# Patient Record
Sex: Female | Born: 1965 | Race: White | Hispanic: No | Marital: Married | Smoking: Never smoker
Health system: Southern US, Community
[De-identification: ages and names within clinical notes are randomized; demographics above are authoritative.]

## PROBLEM LIST (undated history)

## (undated) ENCOUNTER — Ambulatory Visit: Admission: EM

## (undated) DIAGNOSIS — Z9889 Other specified postprocedural states: Secondary | ICD-10-CM

## (undated) DIAGNOSIS — E039 Hypothyroidism, unspecified: Secondary | ICD-10-CM

## (undated) DIAGNOSIS — R112 Nausea with vomiting, unspecified: Secondary | ICD-10-CM

## (undated) DIAGNOSIS — D219 Benign neoplasm of connective and other soft tissue, unspecified: Secondary | ICD-10-CM

## (undated) DIAGNOSIS — E079 Disorder of thyroid, unspecified: Secondary | ICD-10-CM

## (undated) HISTORY — DX: Disorder of thyroid, unspecified: E07.9

## (undated) HISTORY — DX: Benign neoplasm of connective and other soft tissue, unspecified: D21.9

## (undated) HISTORY — PX: AUGMENTATION MAMMAPLASTY: SUR837

---

## 1998-07-07 ENCOUNTER — Other Ambulatory Visit: Admission: RE | Admit: 1998-07-07 | Discharge: 1998-07-07 | Payer: Self-pay | Admitting: Obstetrics and Gynecology

## 1999-08-03 ENCOUNTER — Other Ambulatory Visit: Admission: RE | Admit: 1999-08-03 | Discharge: 1999-08-03 | Payer: Self-pay | Admitting: Obstetrics and Gynecology

## 2000-08-06 ENCOUNTER — Other Ambulatory Visit: Admission: RE | Admit: 2000-08-06 | Discharge: 2000-08-06 | Payer: Self-pay | Admitting: Obstetrics and Gynecology

## 2001-08-19 ENCOUNTER — Other Ambulatory Visit: Admission: RE | Admit: 2001-08-19 | Discharge: 2001-08-19 | Payer: Self-pay | Admitting: Obstetrics and Gynecology

## 2004-11-24 ENCOUNTER — Encounter: Admission: RE | Admit: 2004-11-24 | Discharge: 2004-11-24 | Payer: Self-pay | Admitting: Obstetrics and Gynecology

## 2006-06-20 ENCOUNTER — Encounter: Admission: RE | Admit: 2006-06-20 | Discharge: 2006-06-20 | Payer: Self-pay | Admitting: Obstetrics and Gynecology

## 2008-04-27 ENCOUNTER — Encounter: Admission: RE | Admit: 2008-04-27 | Discharge: 2008-04-27 | Payer: Self-pay | Admitting: Family Medicine

## 2008-05-06 ENCOUNTER — Encounter: Admission: RE | Admit: 2008-05-06 | Discharge: 2008-05-06 | Payer: Self-pay | Admitting: Family Medicine

## 2009-10-27 ENCOUNTER — Encounter: Admission: RE | Admit: 2009-10-27 | Discharge: 2009-10-27 | Payer: Self-pay | Admitting: Obstetrics and Gynecology

## 2010-11-02 ENCOUNTER — Encounter: Admission: RE | Admit: 2010-11-02 | Discharge: 2010-11-02 | Payer: Self-pay | Admitting: Obstetrics and Gynecology

## 2011-09-27 ENCOUNTER — Other Ambulatory Visit: Payer: Self-pay | Admitting: Obstetrics and Gynecology

## 2011-09-27 DIAGNOSIS — Z1231 Encounter for screening mammogram for malignant neoplasm of breast: Secondary | ICD-10-CM

## 2011-11-06 ENCOUNTER — Ambulatory Visit
Admission: RE | Admit: 2011-11-06 | Discharge: 2011-11-06 | Disposition: A | Discharge status: Home / Self Care | Payer: BC Managed Care – PPO | Source: Ambulatory Visit | Attending: Obstetrics and Gynecology | Admitting: Obstetrics and Gynecology

## 2011-11-06 DIAGNOSIS — Z1231 Encounter for screening mammogram for malignant neoplasm of breast: Secondary | ICD-10-CM

## 2012-10-02 ENCOUNTER — Other Ambulatory Visit: Payer: Self-pay | Admitting: Obstetrics and Gynecology

## 2012-10-02 DIAGNOSIS — Z1231 Encounter for screening mammogram for malignant neoplasm of breast: Secondary | ICD-10-CM

## 2012-11-06 ENCOUNTER — Ambulatory Visit
Admission: RE | Admit: 2012-11-06 | Discharge: 2012-11-06 | Disposition: A | Discharge status: Home / Self Care | Payer: BC Managed Care – PPO | Source: Ambulatory Visit | Attending: Obstetrics and Gynecology | Admitting: Obstetrics and Gynecology

## 2012-11-06 DIAGNOSIS — Z1231 Encounter for screening mammogram for malignant neoplasm of breast: Secondary | ICD-10-CM

## 2013-10-01 ENCOUNTER — Other Ambulatory Visit: Payer: Self-pay

## 2013-10-01 DIAGNOSIS — Z1231 Encounter for screening mammogram for malignant neoplasm of breast: Secondary | ICD-10-CM

## 2013-12-22 ENCOUNTER — Ambulatory Visit
Admission: RE | Admit: 2013-12-22 | Discharge: 2013-12-22 | Disposition: A | Discharge status: Home / Self Care | Payer: BC Managed Care – PPO | Source: Ambulatory Visit

## 2013-12-22 DIAGNOSIS — Z1231 Encounter for screening mammogram for malignant neoplasm of breast: Secondary | ICD-10-CM

## 2014-11-10 ENCOUNTER — Other Ambulatory Visit: Payer: Self-pay

## 2014-11-10 DIAGNOSIS — Z1231 Encounter for screening mammogram for malignant neoplasm of breast: Secondary | ICD-10-CM

## 2014-12-23 ENCOUNTER — Ambulatory Visit
Admission: RE | Admit: 2014-12-23 | Discharge: 2014-12-23 | Disposition: A | Discharge status: Home / Self Care | Payer: 59 | Source: Ambulatory Visit

## 2014-12-23 DIAGNOSIS — Z1231 Encounter for screening mammogram for malignant neoplasm of breast: Secondary | ICD-10-CM

## 2014-12-24 ENCOUNTER — Other Ambulatory Visit: Payer: Self-pay | Admitting: Obstetrics and Gynecology

## 2014-12-24 DIAGNOSIS — R928 Other abnormal and inconclusive findings on diagnostic imaging of breast: Secondary | ICD-10-CM

## 2015-01-04 ENCOUNTER — Ambulatory Visit
Admission: RE | Admit: 2015-01-04 | Discharge: 2015-01-04 | Disposition: A | Discharge status: Home / Self Care | Payer: 59 | Source: Ambulatory Visit | Attending: Obstetrics and Gynecology | Admitting: Obstetrics and Gynecology

## 2015-01-04 DIAGNOSIS — R928 Other abnormal and inconclusive findings on diagnostic imaging of breast: Secondary | ICD-10-CM

## 2015-08-17 ENCOUNTER — Encounter: Payer: Self-pay | Admitting: *Deleted

## 2015-08-18 ENCOUNTER — Ambulatory Visit (INDEPENDENT_AMBULATORY_CARE_PROVIDER_SITE_OTHER): Payer: 59 | Admitting: *Deleted

## 2015-08-18 DIAGNOSIS — I8393 Asymptomatic varicose veins of bilateral lower extremities: Secondary | ICD-10-CM

## 2015-08-18 NOTE — Progress Notes (Signed)
X=.3% Sotradecol administered with a 27g butterfly.  Patient received a total of 5cc.  Cutaneous Laser:pulsed mode  810j/cm2 400 ms delay  13 ms Duration 0.5 spot  Treated all the blue spiders and some red. Easy access. Tol well. She may need CL clean up in 6 weeks. Did some on her face too.  Photos: No.  Compression stockings applied: Yes.

## 2015-08-23 ENCOUNTER — Encounter: Payer: Self-pay | Admitting: *Deleted

## 2015-09-28 ENCOUNTER — Encounter: Payer: Self-pay | Admitting: *Deleted

## 2015-09-29 ENCOUNTER — Ambulatory Visit (INDEPENDENT_AMBULATORY_CARE_PROVIDER_SITE_OTHER): Payer: 59 | Admitting: *Deleted

## 2015-09-29 DIAGNOSIS — I8393 Asymptomatic varicose veins of bilateral lower extremities: Secondary | ICD-10-CM

## 2015-09-29 NOTE — Progress Notes (Signed)
Did cutaneous laser everywhere. Tol very well. Hoping for good results. She will return in Dec for any further clean up.  Cutaneous Laser:pulsed mode  810j/cm2 400 ms delay  13 ms Duration 0.5 spot  Total pulses: 1593 Total energy 2.521  Total time::20  Photos: No.  Compression stockings applied: No.NA

## 2015-10-04 ENCOUNTER — Encounter: Payer: Self-pay | Admitting: Vascular Surgery

## 2015-10-27 ENCOUNTER — Ambulatory Visit (INDEPENDENT_AMBULATORY_CARE_PROVIDER_SITE_OTHER): Payer: 59 | Admitting: *Deleted

## 2015-10-27 DIAGNOSIS — I8393 Asymptomatic varicose veins of bilateral lower extremities: Secondary | ICD-10-CM

## 2015-10-27 NOTE — Progress Notes (Signed)
   Cutaneous Laser:pulsed mode  814j/cm2 400 ms delay  13 ms Duration 0.5 spot  Total pulses: 1134 Total energy 1.803  Total time::14  Photos: No.  Compression stockings applied: Yes.     Treated her tiny red vessels with more watts hoping for better closure. Tol well. Will follow prn.

## 2015-11-01 ENCOUNTER — Encounter: Payer: Self-pay | Admitting: Vascular Surgery

## 2015-11-03 ENCOUNTER — Ambulatory Visit: Payer: 59

## 2015-11-11 ENCOUNTER — Other Ambulatory Visit: Payer: Self-pay

## 2015-11-11 DIAGNOSIS — Z1231 Encounter for screening mammogram for malignant neoplasm of breast: Secondary | ICD-10-CM

## 2015-12-01 ENCOUNTER — Ambulatory Visit: Payer: 59

## 2015-12-27 ENCOUNTER — Ambulatory Visit: Payer: 59

## 2015-12-29 ENCOUNTER — Ambulatory Visit
Admission: RE | Admit: 2015-12-29 | Discharge: 2015-12-29 | Disposition: A | Discharge status: Home / Self Care | Payer: BLUE CROSS/BLUE SHIELD | Source: Ambulatory Visit

## 2015-12-29 DIAGNOSIS — Z1231 Encounter for screening mammogram for malignant neoplasm of breast: Secondary | ICD-10-CM

## 2015-12-30 ENCOUNTER — Other Ambulatory Visit: Payer: Self-pay | Admitting: Obstetrics and Gynecology

## 2015-12-30 DIAGNOSIS — R928 Other abnormal and inconclusive findings on diagnostic imaging of breast: Secondary | ICD-10-CM

## 2016-01-05 ENCOUNTER — Ambulatory Visit
Admission: RE | Admit: 2016-01-05 | Discharge: 2016-01-05 | Disposition: A | Discharge status: Home / Self Care | Payer: BLUE CROSS/BLUE SHIELD | Source: Ambulatory Visit | Attending: Obstetrics and Gynecology | Admitting: Obstetrics and Gynecology

## 2016-01-05 DIAGNOSIS — R928 Other abnormal and inconclusive findings on diagnostic imaging of breast: Secondary | ICD-10-CM

## 2016-10-18 ENCOUNTER — Other Ambulatory Visit: Payer: Self-pay | Admitting: Obstetrics and Gynecology

## 2016-10-18 DIAGNOSIS — Z9882 Breast implant status: Secondary | ICD-10-CM

## 2016-10-18 DIAGNOSIS — Z1231 Encounter for screening mammogram for malignant neoplasm of breast: Secondary | ICD-10-CM

## 2016-11-20 ENCOUNTER — Encounter (INDEPENDENT_AMBULATORY_CARE_PROVIDER_SITE_OTHER): Payer: BLUE CROSS/BLUE SHIELD | Admitting: Ophthalmology

## 2016-11-20 DIAGNOSIS — H43813 Vitreous degeneration, bilateral: Secondary | ICD-10-CM

## 2016-11-20 DIAGNOSIS — H33303 Unspecified retinal break, bilateral: Secondary | ICD-10-CM | POA: Diagnosis not present

## 2016-11-20 DIAGNOSIS — H4423 Degenerative myopia, bilateral: Secondary | ICD-10-CM

## 2016-12-13 ENCOUNTER — Ambulatory Visit (INDEPENDENT_AMBULATORY_CARE_PROVIDER_SITE_OTHER): Payer: BLUE CROSS/BLUE SHIELD | Admitting: Ophthalmology

## 2017-01-08 ENCOUNTER — Ambulatory Visit
Admission: RE | Admit: 2017-01-08 | Discharge: 2017-01-08 | Disposition: A | Discharge status: Home / Self Care | Payer: BLUE CROSS/BLUE SHIELD | Source: Ambulatory Visit | Attending: Obstetrics and Gynecology | Admitting: Obstetrics and Gynecology

## 2017-01-08 DIAGNOSIS — Z1231 Encounter for screening mammogram for malignant neoplasm of breast: Secondary | ICD-10-CM

## 2017-01-08 DIAGNOSIS — Z9882 Breast implant status: Secondary | ICD-10-CM

## 2017-07-23 ENCOUNTER — Encounter: Payer: Self-pay | Admitting: Endocrinology

## 2017-10-29 ENCOUNTER — Other Ambulatory Visit: Payer: Self-pay | Admitting: Obstetrics and Gynecology

## 2017-10-29 DIAGNOSIS — Z1231 Encounter for screening mammogram for malignant neoplasm of breast: Secondary | ICD-10-CM

## 2018-01-09 ENCOUNTER — Ambulatory Visit
Admission: RE | Admit: 2018-01-09 | Discharge: 2018-01-09 | Disposition: A | Discharge status: Home / Self Care | Payer: BLUE CROSS/BLUE SHIELD | Source: Ambulatory Visit | Attending: Obstetrics and Gynecology | Admitting: Obstetrics and Gynecology

## 2018-01-09 DIAGNOSIS — Z1231 Encounter for screening mammogram for malignant neoplasm of breast: Secondary | ICD-10-CM

## 2018-08-09 ENCOUNTER — Ambulatory Visit (INDEPENDENT_AMBULATORY_CARE_PROVIDER_SITE_OTHER): Payer: BLUE CROSS/BLUE SHIELD | Admitting: Endocrinology

## 2018-08-09 ENCOUNTER — Encounter

## 2018-08-09 ENCOUNTER — Encounter: Payer: Self-pay | Admitting: Endocrinology

## 2018-08-09 VITALS — BP 132/80 | HR 96 | Ht 62.0 in | Wt 118.0 lb

## 2018-08-09 DIAGNOSIS — M2550 Pain in unspecified joint: Secondary | ICD-10-CM

## 2018-08-09 DIAGNOSIS — Z8639 Personal history of other endocrine, nutritional and metabolic disease: Secondary | ICD-10-CM

## 2018-08-09 DIAGNOSIS — R5383 Other fatigue: Secondary | ICD-10-CM | POA: Diagnosis not present

## 2018-08-09 NOTE — Progress Notes (Signed)
Patient ID: Tamara Adkins, female   DOB: 26-Feb-1966, 52 y.o.   MRN: [id number]                                                                                                              Reason for Appointment:  Hyperthyroidism, new consultation  Referring physician: Tamsen Roers   Chief complaint: Joint pains   History of Present Illness:   Apparently in 2004 the patient was diagnosed to have hyperthyroidism At that time she was having problems with frequent hives that continued for several months Also at times she would be feeling some palpitations She does not think she remembers any symptoms of unusual weight loss, sweating or heat intolerance  After her diagnosis of hyperthyroidism she was treated with PTU, initially higher doses and then subsequently this was reduced to apparently 50 mg twice daily by her endocrinologist No records are available from previous management She was told to continue the medication and was subjectively feeling quite well Her last evaluation with the endocrinologist was in September 2018 and no changes were made  In April 2019 patient started having joint pains in multiple joints Also she thinks she may have been starting to feel more tired Did not feel that she had any significant weight change, heat or cold intolerance at that time Also over the last 6 months has had a tendency to hair loss  She was evaluated by her PCP in June and her TSH was high she was told by her PCP to stop her PTU, TSH 11.6 and 2 days later was 6.5 On 06/19/2018 TSH was 5.17 with normal free T4  She continues to have joint pains but may not be as tired as before Still has some hair loss She is here for further evaluation  Wt Readings from Last 3 Encounters:  08/09/18 118 lb (53.5 kg)     Thyroid function tests as follows:     No results found for: FREET4, T3FREE, TSH  No results found for: THYROTRECAB   Allergies as of 08/09/2018   No Known Allergies       Medication List    as of 08/09/2018  4:15 PM   You have not been prescribed any medications.         Past Medical History:  Diagnosis Date  . Fibroids   . Thyroid disease     Past Surgical History:  Procedure Laterality Date  . AUGMENTATION MAMMAPLASTY Bilateral     Family History  Problem Relation Age of Onset  . Hyperlipidemia Mother   . Hypertension Mother   . Hyperlipidemia Father   . Hypertension Father   . Stroke Father   . Breast cancer Neg Hx     Social History:  reports that she has never smoked. She has never used smokeless tobacco. She reports that she drank alcohol. She reports that she has current or past drug history.  Allergies: No Known Allergies   Review of Systems  Constitutional: Negative for weight loss and reduced appetite.  HENT: Negative  for trouble swallowing.   Respiratory: Negative for shortness of breath.   Cardiovascular: Negative for leg swelling.  Gastrointestinal: Negative for constipation.  Endocrine: Positive for fatigue.  Musculoskeletal: Positive for joint pain.       Has pains in all her joints, some stiffness of fingers in the mornings  Skin:       Has mild hair loss  Neurological: Negative for numbness.  Psychiatric/Behavioral: Negative for insomnia.       Examination:   BP 132/80   Pulse 96   Ht 5\' 2"  (1.575 m)   Wt 118 lb (53.5 kg)   LMP 04/18/2018 (Exact Date)   SpO2 98%   BMI 21.58 kg/m    General Appearance:  well-built and nourished, pleasant, not anxious      Eyes: No abnormal prominence, lid lag or stare present.  No swelling of the eyelids   Neck: The thyroid is nonpalpable  There is no lymphadenopathy in the neck .           Heart: normal S1 and S2, no murmurs .          Lungs: breath sounds are normal bilaterally without added sounds  Abdomen: no hepatosplenomegaly or other palpable abnormality  Extremities: hands are warm. No ankle edema. Peripheral joints do not show any signs of swelling,  deformity or tenderness  Neurological:  No tremor Biceps and ankle reflexes appear normal  Skin: No rash, abnormal thickening of the skin on the lower legs seen     Assessment/Plan:   Hyperthyroidism by history, previously likely from Graves' disease    Patient became hypothyroid on low-dose PTU in 6/19 and PTU discontinued She does not currently have any symptoms of hyperthyroidism or hypothyroidism and her last TSH was nearly normal at 5.2  Appears that the patient is in remission from her hyperthyroidism and had become mildly hypothyroid while on PTU This was improving after stopping antithyroid drug  Most likely she does not need any further treatment although does need follow-up thyroid levels to make sure she is euthyroid Further management will be decided based on lab results  ARTHRALGIA: She has persistent joint pains in multiple areas without any objective signs of inflammation or swelling CRP was normal in June Discussed that this is not related to her thyroid and she will need to be evaluated by the rheumatologist, she will discuss this with her PCP  Consult note sent to referring physician  Elayne Snare 08/09/2018, 4:15 PM    Note: This office note was prepared with Dragon voice recognition system technology. Any transcriptional errors that result from this process are unintentional.

## 2018-08-10 ENCOUNTER — Telehealth: Payer: Self-pay | Admitting: Endocrinology

## 2018-08-10 DIAGNOSIS — Z8639 Personal history of other endocrine, nutritional and metabolic disease: Secondary | ICD-10-CM | POA: Insufficient documentation

## 2018-08-10 NOTE — Telephone Encounter (Signed)
Please patient that I mistakenly thought her last thyroid labs were from August 2019 but they are from August 2018.  Most recent thyroid test was indicating slight underactive thyroid and this was done in 7/19.  She does need to repeat her thyroid levels now.  She can either go to her PCP or come here.  Labs have been ordered

## 2018-08-13 NOTE — Telephone Encounter (Signed)
lft vm for pt to return call 

## 2018-08-16 NOTE — Telephone Encounter (Signed)
Called pt stated that to let her most recent thyroid test was done 7/19--underactive thyroid. Advise pt to repeat thyroid test per Dr. Dwyane Dee and pt stated like to get it done with her PCP.

## 2018-08-16 NOTE — Telephone Encounter (Signed)
Please make sure she tells her PCP to send [place] a copy

## 2018-08-16 NOTE — Telephone Encounter (Signed)
Please make sure patient has received previous message and is getting her thyroid labs done

## 2018-08-19 NOTE — Telephone Encounter (Signed)
Called pt stated with Dr. Dwyane Dee instructions--pt stated get copy from PCP and will send to the office.

## 2018-08-21 LAB — TSH: TSH: 2.75 (ref 0.41–5.90)

## 2018-08-28 ENCOUNTER — Encounter: Payer: Self-pay | Admitting: Endocrinology

## 2018-08-28 NOTE — Progress Notes (Signed)
[facility]/thx dmf

## 2018-10-02 ENCOUNTER — Telehealth: Payer: Self-pay | Admitting: Endocrinology

## 2018-10-02 NOTE — Telephone Encounter (Signed)
Patient called re: requesting her lab results. Please call patient at ph# [phone].

## 2018-10-02 NOTE — Telephone Encounter (Signed)
Pt is aware.  

## 2018-10-02 NOTE — Telephone Encounter (Signed)
TSH from 9/11 is what she is referring to can you please review this result according to Dr. Ronnie Derby recommendations  Thyroid antibodies were also drawn and it is not abstracted but the level result was 127.  Please advise on what pt needs to do and what these levels mean for her care

## 2018-10-02 NOTE — Telephone Encounter (Signed)
It appears that her TSH has normalized.  As of now, it is 2.75.  This is excellent.  She does not need to restart PTU.  Per Dr. Ronnie Derby last note, patient will need to see rheumatology for her joint and muscle aches.  I do not have the antibody result in the child's.  I am not sure which antibodies were drawn and what is the normal range.  However, if only the antibodies are abnormal and functional thyroid tests are normal, we only observe the thyroid test without intervention.

## 2018-10-03 ENCOUNTER — Telehealth: Payer: Self-pay | Admitting: Endocrinology

## 2018-10-03 NOTE — Telephone Encounter (Signed)
Patient is wanting to see a rheumatologist but needs Dr Dwyane Dee to place a referral for her to schedule. The Patient would like to be referred to Dr Trudie Reed @ [facility]

## 2018-10-08 NOTE — Telephone Encounter (Signed)
Called pt--stated having joint and muscle pain...which dr. Dwyane Dee stated not from Thyroid and needed rheumatologist. Pt requesting referral to Dr. Trudie Reed @ [facility] rheumat[facility].

## 2018-10-22 NOTE — Progress Notes (Signed)
Patient ID: Tamara Adkins, female   DOB: 1966/03/14, 52 y.o.   MRN: [id number]                                                                                                              Reason for Appointment: Follow-up of thyroid  Referring physician: Tamsen Roers   Chief complaint: Joint pains   History of Present Illness:   Apparently in 2004 the patient was diagnosed to have hyperthyroidism At that time she was having problems with frequent hives that continued for several months Also at times she would be feeling some palpitations She does not think she remembers any symptoms of unusual weight loss, sweating or heat intolerance  After her diagnosis of hyperthyroidism she was treated with PTU, initially higher doses and then subsequently this was reduced to apparently 50 mg twice daily by her endocrinologist No records are available from previous management She was told to continue the medication and was subjectively feeling quite well Her last evaluation with the endocrinologist was in September 2018 and no changes were made  In April 2019 patient started having joint pains in multiple joints Also she thinks she may have been starting to feel more tired Did not feel that she had any significant weight change, heat or cold intolerance at that time Also over the last 6 months has had a tendency to hair loss  She was evaluated by her PCP in June and her TSH was high she was told by her PCP to stop her PTU, TSH 11.6 and 2 days later was 6.5 On 06/19/2018 TSH was 5.17 with normal free T4  She continues to have joint pains but may not be as tired as before Still has some hair loss She is here for further evaluation  Wt Readings from Last 3 Encounters:  10/23/18 121 lb (54.9 kg)  08/09/18 118 lb (53.5 kg)     Thyroid function tests as follows:     Lab Results  Component Value Date   TSH 2.75 08/21/2018    No results found for: THYROTRECAB   Allergies as of 10/23/2018     No Known Allergies     Medication List    as of 10/23/2018  4:00 PM   You have not been prescribed any medications.         Past Medical History:  Diagnosis Date  . Fibroids   . Thyroid disease     Past Surgical History:  Procedure Laterality Date  . AUGMENTATION MAMMAPLASTY Bilateral     Family History  Problem Relation Age of Onset  . Hyperlipidemia Mother   . Hypertension Mother   . Hyperlipidemia Father   . Hypertension Father   . Stroke Father   . Thyroid disease Maternal Grandmother        hypothroid  . Breast cancer Neg Hx     Social History:  reports that she has never smoked. She has never used smokeless tobacco. She reports that she drank alcohol. She reports that she has current or  past drug history.  Allergies: No Known Allergies   Review of Systems  Has periodic hot flashes    Examination:   BP 124/80   Pulse 86   Resp 16   Ht 5\' 2"  (1.575 m)   Wt 121 lb (54.9 kg)   SpO2 99%   BMI 22.13 kg/m    The thyroid is nonpalpable  Peripheral joints do not show any signs of swelling, deformity or tenderness   Biceps and ankle reflexes appear normal  No muscle tenderness over the trapezius area   Assessment/Plan:   Hyperthyroidism by history, previously likely from Graves' disease   Appears that the patient is in remission from her hyperthyroidism with last TSH normal No thyroid enlargement currently and she looks euthyroid She does continue have some symptoms of fatigue  Further management will be decided based on lab results  ARTHRALGIA: She has persistent joint pains in multiple areas without any objective signs of inflammation or swelling  She does need rheumatology evaluation and this was done since she has not had this ordered by her PCP  Elayne Snare 10/23/2018, 4:00 PM    Note: This office note was prepared with Dragon voice recognition system technology. Any transcriptional errors that result from this process are  unintentional.  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

## 2018-10-23 ENCOUNTER — Ambulatory Visit (INDEPENDENT_AMBULATORY_CARE_PROVIDER_SITE_OTHER): Payer: BLUE CROSS/BLUE SHIELD | Admitting: Endocrinology

## 2018-10-23 ENCOUNTER — Encounter: Payer: Self-pay | Admitting: Endocrinology

## 2018-10-23 VITALS — BP 124/80 | HR 86 | Resp 16 | Ht 62.0 in | Wt 121.0 lb

## 2018-10-23 DIAGNOSIS — Z8639 Personal history of other endocrine, nutritional and metabolic disease: Secondary | ICD-10-CM | POA: Diagnosis not present

## 2018-10-23 DIAGNOSIS — N951 Menopausal and female climacteric states: Secondary | ICD-10-CM | POA: Diagnosis not present

## 2018-10-23 DIAGNOSIS — R5383 Other fatigue: Secondary | ICD-10-CM

## 2018-10-23 DIAGNOSIS — M2550 Pain in unspecified joint: Secondary | ICD-10-CM

## 2018-10-23 LAB — T4, FREE: Free T4: 0.54 ng/dL — ABNORMAL LOW (ref 0.60–1.60)

## 2018-10-23 LAB — TSH: TSH: 2.76 u[IU]/mL (ref 0.35–4.50)

## 2018-10-24 ENCOUNTER — Telehealth: Payer: Self-pay | Admitting: Endocrinology

## 2018-10-24 ENCOUNTER — Encounter: Payer: Self-pay | Admitting: Endocrinology

## 2018-10-24 ENCOUNTER — Telehealth: Payer: Self-pay

## 2018-10-24 ENCOUNTER — Other Ambulatory Visit: Payer: Self-pay | Admitting: Endocrinology

## 2018-10-24 DIAGNOSIS — E038 Other specified hypothyroidism: Secondary | ICD-10-CM

## 2018-10-24 LAB — FOLLICLE STIMULATING HORMONE: FSH: 157.5 m[IU]/mL

## 2018-10-24 MED ORDER — LEVOTHYROXINE SODIUM 25 MCG PO TABS: 25.0000 ug | ORAL_TABLET | Freq: Every day | ORAL | 3 refills | Status: DC

## 2018-10-24 NOTE — Telephone Encounter (Signed)
Per [facility] "Caller states she is returning a call from the office."

## 2018-10-24 NOTE — Addendum Note (Signed)
Addended by: Kaylyn Lim I on: 10/24/2018 01:37 PM   Modules accepted: Orders

## 2018-10-24 NOTE — Telephone Encounter (Signed)
Resolved

## 2018-10-24 NOTE — Telephone Encounter (Signed)
Patient requested labs be mailed to her which I did

## 2018-11-25 ENCOUNTER — Other Ambulatory Visit (INDEPENDENT_AMBULATORY_CARE_PROVIDER_SITE_OTHER): Payer: BLUE CROSS/BLUE SHIELD

## 2018-11-25 DIAGNOSIS — E038 Other specified hypothyroidism: Secondary | ICD-10-CM | POA: Diagnosis not present

## 2018-11-25 LAB — T3, FREE: T3 FREE: 3.2 pg/mL (ref 2.3–4.2)

## 2018-11-25 LAB — T4, FREE: Free T4: 0.6 ng/dL (ref 0.60–1.60)

## 2018-11-25 LAB — TSH: TSH: 1.54 u[IU]/mL (ref 0.35–4.50)

## 2018-11-27 ENCOUNTER — Other Ambulatory Visit: Payer: BLUE CROSS/BLUE SHIELD

## 2018-11-27 ENCOUNTER — Other Ambulatory Visit: Payer: Self-pay | Admitting: Obstetrics and Gynecology

## 2018-11-27 DIAGNOSIS — Z1231 Encounter for screening mammogram for malignant neoplasm of breast: Secondary | ICD-10-CM

## 2018-12-02 ENCOUNTER — Ambulatory Visit (INDEPENDENT_AMBULATORY_CARE_PROVIDER_SITE_OTHER): Payer: BLUE CROSS/BLUE SHIELD | Admitting: Endocrinology

## 2018-12-02 ENCOUNTER — Encounter: Payer: Self-pay | Admitting: Endocrinology

## 2018-12-02 VITALS — BP 122/68 | HR 69 | Ht 62.0 in | Wt 117.4 lb

## 2018-12-02 DIAGNOSIS — E038 Other specified hypothyroidism: Secondary | ICD-10-CM | POA: Diagnosis not present

## 2018-12-02 NOTE — Progress Notes (Signed)
Patient ID: Tamara Adkins, female   DOB: [birth year], 52 y.o.   MRN: [id number]                                                                                                              Reason for Appointment: Follow-up of thyroid  Referring physician: Tamsen Roers   Chief complaint: Joint pains   History of Present Illness:   Apparently in 2004 the patient was diagnosed to have hyperthyroidism At that time she was having problems with frequent hives that continued for several months Also at times she would be feeling some palpitations She does not think she remembers any symptoms of unusual weight loss, sweating or heat intolerance  After her diagnosis of hyperthyroidism she was treated with PTU, initially higher doses and then subsequently this was reduced to apparently 50 mg twice daily by her endocrinologist No records are available from previous management She was told to continue the medication and was subjectively feeling quite well Her last evaluation with the endocrinologist was in September 2018 and no changes were made  In April 2019 patient started having joint pains in multiple joints Also she thinks she may have been starting to feel more tired Did not feel that she had any significant weight change, heat or cold intolerance at that time Also over the last 6 months has had a tendency to hair loss  She was evaluated by her PCP in June and her TSH was high she was told by her PCP to stop her PTU, TSH 11.6 and 2 days later was 6.5 On 06/19/2018 TSH was 5.17 with normal free T4  RECENT history:  Because of her complaints of fatigue and arthralgia along with a low free T4 level in 10/2018 she was started on levothyroxine 25 mcg daily TSH was normal After 1 week she was supposed to go up to 50 mcg but she started having a little palpitations with this and went back to 25 mcg  She continues to have joint pains but may not be as tired as before No  hair loss or cold  intolerance Her weight is down slightly   Her free T4 is improved although still low normal, normal TSH  Wt Readings from Last 3 Encounters:  12/02/18 117 lb 6.4 oz (53.3 kg)  10/23/18 121 lb (54.9 kg)  08/09/18 118 lb (53.5 kg)     Thyroid function tests as follows:     Lab Results  Component Value Date   FREET4 0.60 11/25/2018   FREET4 0.54 (L) 10/23/2018   T3FREE 3.2 11/25/2018   TSH 1.54 11/25/2018   TSH 2.76 10/23/2018   TSH 2.75 08/21/2018    No results found for: THYROTRECAB   Allergies as of 12/02/2018   No Known Allergies     Medication List       Accurate as of December 02, 2018 11:06 AM. Always use your most recent med list.        levothyroxine 25 MCG tablet Commonly known as:  SYNTHROID Take 1  tablet (25 mcg total) by mouth daily before breakfast. 1 tablet  for 2 weeks and then 2 tablets daily before breakfast daily           Past Medical History:  Diagnosis Date  . Fibroids   . Thyroid disease     Past Surgical History:  Procedure Laterality Date  . AUGMENTATION MAMMAPLASTY Bilateral     Family History  Problem Relation Age of Onset  . Hyperlipidemia Mother   . Hypertension Mother   . Hyperlipidemia Father   . Hypertension Father   . Stroke Father   . Thyroid disease Maternal Grandmother        hypothroid  . Breast cancer Neg Hx     Social History:  reports that she has never smoked. She has never used smokeless tobacco. She reports previous alcohol use. She reports previous drug use.  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 Any transcriptional errors that result from this process are unintentional.     Elayne Snare

## 2018-12-02 NOTE — Patient Instructions (Signed)
1 1/2 pills daily

## 2018-12-25 ENCOUNTER — Ambulatory Visit: Payer: BLUE CROSS/BLUE SHIELD | Admitting: Endocrinology

## 2019-01-13 ENCOUNTER — Ambulatory Visit
Admission: RE | Admit: 2019-01-13 | Discharge: 2019-01-13 | Disposition: A | Discharge status: Home / Self Care | Payer: BLUE CROSS/BLUE SHIELD | Source: Ambulatory Visit | Attending: Obstetrics and Gynecology | Admitting: Obstetrics and Gynecology

## 2019-01-13 DIAGNOSIS — Z1231 Encounter for screening mammogram for malignant neoplasm of breast: Secondary | ICD-10-CM

## 2019-02-24 ENCOUNTER — Other Ambulatory Visit (INDEPENDENT_AMBULATORY_CARE_PROVIDER_SITE_OTHER): Payer: BLUE CROSS/BLUE SHIELD

## 2019-02-24 ENCOUNTER — Other Ambulatory Visit: Payer: Self-pay

## 2019-02-24 DIAGNOSIS — E038 Other specified hypothyroidism: Secondary | ICD-10-CM

## 2019-02-24 LAB — T4, FREE: Free T4: 0.77 ng/dL (ref 0.60–1.60)

## 2019-02-24 LAB — T3, FREE: T3 FREE: 3.8 pg/mL (ref 2.3–4.2)

## 2019-02-24 LAB — TSH: TSH: 1.23 u[IU]/mL (ref 0.35–4.50)

## 2019-03-03 ENCOUNTER — Encounter: Payer: Self-pay | Admitting: Endocrinology

## 2019-03-03 ENCOUNTER — Other Ambulatory Visit: Payer: Self-pay

## 2019-03-03 ENCOUNTER — Ambulatory Visit: Payer: BLUE CROSS/BLUE SHIELD | Admitting: Endocrinology

## 2019-03-03 ENCOUNTER — Ambulatory Visit (INDEPENDENT_AMBULATORY_CARE_PROVIDER_SITE_OTHER): Payer: BLUE CROSS/BLUE SHIELD | Admitting: Endocrinology

## 2019-03-03 VITALS — BP 106/70 | HR 96 | Temp 97.5°F | Ht 62.0 in | Wt 117.2 lb

## 2019-03-03 DIAGNOSIS — E038 Other specified hypothyroidism: Secondary | ICD-10-CM | POA: Diagnosis not present

## 2019-03-03 NOTE — Progress Notes (Signed)
Patient ID: Tamara Adkins, female   DOB: Sep 07, 1966, 53 y.o.   MRN: [id number]                                                                                                              Reason for Appointment: Follow-up of thyroid  Referring physician: Tamsen Roers   Chief complaint: Joint pains   History of Present Illness:   Apparently in 2004 the patient was diagnosed to have hyperthyroidism At that time she was having problems with frequent hives that continued for several months Also at times she would be feeling some palpitations She does not think she remembers any symptoms of unusual weight loss, sweating or heat intolerance  After her diagnosis of hyperthyroidism she was treated with PTU, initially higher doses and then subsequently this was reduced to apparently 50 mg twice daily by her endocrinologist No records are available from previous management She was told to continue the medication and was subjectively feeling quite well Her last evaluation with the endocrinologist was in September 2018 and no changes were made  In April 2019 patient started having joint pains in multiple joints Also she thinks she may have been starting to feel more tired Did not feel that she had any significant weight change, heat or cold intolerance at that time Also over the last 6 months has had a tendency to hair loss  She was evaluated by her PCP in June and her TSH was high she was told by her PCP to stop her PTU, TSH 11.6 and 2 days later was 6.5 On 06/19/2018 TSH was 5.17 with normal free T4  RECENT history:  Because of her complaints of fatigue and arthralgia along with a low free T4 level in 10/2018 she was started on levothyroxine 25 mcg daily TSH has been consistently normal Although she was recommended 50 mcg subsequently she felt some palpitations with this and is now taking only 37.5 mcg  Her dose was increased after her visit in 12/19  She does state that she has overall  improved with thyroxine supplementation with her energy level and is not as tired No  hair loss or cold intolerance still and her weight is stable Recently not having as many joint pains   Her free T4 is improved slightly  Wt Readings from Last 3 Encounters:  03/03/19 117 lb 3.2 oz (53.2 kg)  12/02/18 117 lb 6.4 oz (53.3 kg)  10/23/18 121 lb (54.9 kg)     Thyroid function tests as follows:     Lab Results  Component Value Date   FREET4 0.77 02/24/2019   FREET4 0.60 11/25/2018   FREET4 0.54 (L) 10/23/2018   T3FREE 3.8 02/24/2019   T3FREE 3.2 11/25/2018   TSH 1.23 02/24/2019   TSH 1.54 11/25/2018   TSH 2.76 10/23/2018    No results found for: THYROTRECAB   Allergies as of 03/03/2019   No Known Allergies     Medication List       Accurate as of March 03, 2019  9:32 AM. Always use your most recent med list.        levothyroxine 25 MCG tablet Commonly known as:  SYNTHROID, LEVOTHROID Take 25 mcg by mouth daily before breakfast. Take 1.5 tablets by mouth once daily.           Past Medical History:  Diagnosis Date  . Fibroids   . Thyroid disease     Past Surgical History:  Procedure Laterality Date  . AUGMENTATION MAMMAPLASTY Bilateral     Family History  Problem Relation Age of Onset  . Hyperlipidemia Mother   . Hypertension Mother   . Hyperlipidemia Father   . Hypertension Father   . Stroke Father   . Thyroid disease Maternal Grandmother        hypothroid  . Breast cancer Neg Hx     Social History:  reports that she has never smoked. She has never used smokeless tobacco. She reports previous alcohol use. She reports previous drug use.  Allergies: No Known Allergies   Review of Systems  Has periodic hot flashes and night sweats and is going to discuss with her gynecologist next month, has not been on HRT    Examination:   BP 106/70 (BP Location: Left Arm, Patient Position: Sitting, Cuff Size: Normal)   Pulse 96   Temp (!) 97.5 F (36.4 C)  (Oral)   Ht 5\' 2"  (1.575 m)   Wt 117 lb 3.2 oz (53.2 kg)   SpO2 99%   BMI 21.44 kg/m   She looks well    Assessment/Plan:  Mild secondary hypothyroidism:  She is subjectively doing better with supplementation using levothyroxine 37.5 mcg Initially started this in 11/19  She is not complaining of fatigue now and also her joint pains appear to be better Since her free T4 level is normal and she is doing fairly well we will continue the same dose for another 4 months  ARTHRALGIA: No etiology found and did not get better with nonsteroidal drugs, now subjectively better  Hot flashes: She will discuss further with gynecologist, discussed short-term HRT being safe  Elayne Snare 03/03/2019, 9:32 AM    Note: This office note was prepared with Dragon voice recognition system technology. Any transcriptional errors that result from this process are unintentional.     Elayne Snare

## 2019-04-05 ENCOUNTER — Other Ambulatory Visit: Payer: Self-pay | Admitting: Endocrinology

## 2019-06-23 ENCOUNTER — Other Ambulatory Visit: Payer: BLUE CROSS/BLUE SHIELD

## 2019-06-30 ENCOUNTER — Ambulatory Visit: Payer: BLUE CROSS/BLUE SHIELD | Admitting: Endocrinology

## 2019-07-01 ENCOUNTER — Other Ambulatory Visit: Payer: Self-pay | Admitting: Endocrinology

## 2019-07-02 ENCOUNTER — Other Ambulatory Visit (INDEPENDENT_AMBULATORY_CARE_PROVIDER_SITE_OTHER): Payer: BC Managed Care – PPO

## 2019-07-02 ENCOUNTER — Other Ambulatory Visit: Payer: Self-pay

## 2019-07-02 DIAGNOSIS — E038 Other specified hypothyroidism: Secondary | ICD-10-CM

## 2019-07-02 LAB — TSH: TSH: 2.64 u[IU]/mL (ref 0.35–4.50)

## 2019-07-02 LAB — T4, FREE: Free T4: 0.76 ng/dL (ref 0.60–1.60)

## 2019-07-07 ENCOUNTER — Ambulatory Visit (INDEPENDENT_AMBULATORY_CARE_PROVIDER_SITE_OTHER): Payer: BC Managed Care – PPO | Admitting: Endocrinology

## 2019-07-07 ENCOUNTER — Other Ambulatory Visit: Payer: Self-pay

## 2019-07-07 ENCOUNTER — Encounter: Payer: Self-pay | Admitting: Endocrinology

## 2019-07-07 DIAGNOSIS — E038 Other specified hypothyroidism: Secondary | ICD-10-CM

## 2019-07-07 MED ORDER — LEVOTHYROXINE SODIUM 25 MCG PO TABS: ORAL_TABLET | ORAL | 3 refills | Status: DC

## 2019-07-07 NOTE — Progress Notes (Signed)
Patient ID: Tamara Adkins, female   DOB: 08/24/1966, 53 y.o.   MRN: [id number]                                                                                                              Reason for Appointment: Follow-up of thyroid  Referring physician: Tamsen Roers  Today's office visit was provided via telemedicine using video technique The patient was explained the limitations of evaluation and management by telemedicine and the availability of in person appointments.  The patient understood the limitations and agreed to proceed. Patient also understood that the telehealth visit is billable. . Location of the patient: Patient's home . Location of the provider: Physician office Only the patient and myself were participating in the encounter     Chief complaint: Joint pains   History of Present Illness:   Reportedly in 2004 the patient was diagnosed to have hyperthyroidism At that time she was having problems with frequent hives that continued for several months Also at times she would be feeling some palpitations She does not think she remembers any symptoms of unusual weight loss, sweating or heat intolerance  After her diagnosis of hyperthyroidism she was treated with PTU, initially higher doses and then subsequently this was reduced to apparently 50 mg twice daily by her endocrinologist No records are available from previous management She was told to continue the medication and was subjectively feeling quite well Her last evaluation with the endocrinologist was in September 2018 and no changes were made  In April 2019 patient started having joint pains in multiple joints Also she thinks she may have been starting to feel more tired Did not feel that she had any significant weight change, heat or cold intolerance at that time Also over the last 6 months has had a tendency to hair loss  She was evaluated by her PCP in June and her TSH was high she was told by her PCP to  stop her PTU, TSH 11.6 and 2 days later was 6.5 On 06/19/2018 TSH was 5.17 with normal free T4  RECENT history:  Because of her complaints of fatigue and arthralgia along with a low free T4 level in 10/2018 she was started on levothyroxine 25 mcg daily Although she was recommended 50 mcg subsequently she felt some palpitations with this dosage She is now taking 37.5 mcg since about 11/2018  The dose has been continued unchanged Subjectively she has had improvement in her fatigue and now feels her energy level is normal Her weight is stable She is not having any palpitations also  Also her symptoms of joint pains appear to be resolving She is taking her thyroid supplement consistently every morning    Her free T4 is improved and stable now  Wt Readings from Last 3 Encounters:  03/03/19 117 lb 3.2 oz (53.2 kg)  12/02/18 117 lb 6.4 oz (53.3 kg)  10/23/18 121 lb (54.9 kg)     Thyroid function tests as follows:     Lab Results  Component Value Date  FREET4 0.76 07/02/2019   FREET4 0.77 02/24/2019   FREET4 0.60 11/25/2018   T3FREE 3.8 02/24/2019   T3FREE 3.2 11/25/2018   TSH 2.64 07/02/2019   TSH 1.23 02/24/2019   TSH 1.54 11/25/2018    No results found for: THYROTRECAB   Allergies as of 07/07/2019   No Known Allergies     Medication List       Accurate as of July 07, 2019  8:28 AM. If you have any questions, ask your nurse or doctor.        Euthyrox 25 MCG tablet Generic drug: levothyroxine TAKE 1 TABLET BY MOUTH ONCE DAILY BEFORE BREAKFAST FOR 2 WEEKS AND THEN 2 ONCE DAILY BEFORE BREAKFAST ONCE DAILY           Past Medical History:  Diagnosis Date  . Fibroids   . Thyroid disease     Past Surgical History:  Procedure Laterality Date  . AUGMENTATION MAMMAPLASTY Bilateral     Family History  Problem Relation Age of Onset  . Hyperlipidemia Mother   . Hypertension Mother   . Hyperlipidemia Father   . Hypertension Father   . Stroke Father   .  Thyroid disease Maternal Grandmother        hypothroid  . Breast cancer Neg Hx     Social History:  reports that she has never smoked. She has never used smokeless tobacco. She reports previous alcohol use. She reports previous drug use.  Allergies: No Known Allergies   Review of Systems  Has periodic hot flashes and night sweats which are somewhat better Is going to discuss the symptoms with her gynecologist at her upcoming appointment, has not been on HRT    Examination:   There were no vitals taken for this visit.  She looks well    Assessment/Plan:  Mild secondary hypothyroidism:  She is subjectively doing better with her energy level using supplementation of levothyroxine 37.5 mcg Initially started this in 11/19  Overall has been consistently improved and she feels he is back to normal  Since her free T4 level is again normal she will continue the same dose of 37.5 mcg   ARTHRALGIA: No etiology found and did not get better with nonsteroidal drugs However she appears to be not having any further symptoms  Hot flashes: She will discuss further with gynecologist  Follow-up in 6 months  Florentino Laabs Dwyane Dee 07/07/2019, 8:28 AM    Note: This office note was prepared with Dragon voice recognition system technology. Any transcriptional errors that result from this process are unintentional.     Elayne Snare

## 2019-08-06 DIAGNOSIS — M7541 Impingement syndrome of right shoulder: Secondary | ICD-10-CM | POA: Insufficient documentation

## 2019-08-06 DIAGNOSIS — M419 Scoliosis, unspecified: Secondary | ICD-10-CM | POA: Insufficient documentation

## 2019-08-06 DIAGNOSIS — M7531 Calcific tendinitis of right shoulder: Secondary | ICD-10-CM | POA: Insufficient documentation

## 2019-08-11 ENCOUNTER — Other Ambulatory Visit: Payer: Self-pay | Admitting: Endocrinology

## 2019-12-18 ENCOUNTER — Other Ambulatory Visit: Payer: Self-pay | Admitting: Obstetrics and Gynecology

## 2019-12-18 DIAGNOSIS — Z1231 Encounter for screening mammogram for malignant neoplasm of breast: Secondary | ICD-10-CM

## 2019-12-31 ENCOUNTER — Other Ambulatory Visit (INDEPENDENT_AMBULATORY_CARE_PROVIDER_SITE_OTHER): Payer: BC Managed Care – PPO

## 2019-12-31 ENCOUNTER — Other Ambulatory Visit: Payer: BC Managed Care – PPO

## 2019-12-31 DIAGNOSIS — E038 Other specified hypothyroidism: Secondary | ICD-10-CM | POA: Diagnosis not present

## 2019-12-31 LAB — TSH: TSH: 2.29 u[IU]/mL (ref 0.35–4.50)

## 2019-12-31 LAB — T4, FREE: Free T4: 0.77 ng/dL (ref 0.60–1.60)

## 2020-01-05 ENCOUNTER — Ambulatory Visit (INDEPENDENT_AMBULATORY_CARE_PROVIDER_SITE_OTHER): Payer: BC Managed Care – PPO | Admitting: Endocrinology

## 2020-01-05 ENCOUNTER — Other Ambulatory Visit: Payer: Self-pay

## 2020-01-05 ENCOUNTER — Encounter: Payer: Self-pay | Admitting: Endocrinology

## 2020-01-05 VITALS — BP 124/80 | HR 99 | Ht 61.0 in | Wt 120.0 lb

## 2020-01-05 DIAGNOSIS — E038 Other specified hypothyroidism: Secondary | ICD-10-CM

## 2020-01-05 NOTE — Progress Notes (Signed)
Patient ID: Tamara Adkins, female   DOB: 1966/10/02, 54 y.o.   MRN: [id number]                                                                                                              Reason for Appointment: Follow-up of thyroid  Referring physician: Tamsen Roers    Chief complaint: Follow-up   History of Present Illness:   Prior history: Reportedly in 2004 the patient was diagnosed to have hyperthyroidism At that time she was having problems with frequent hives that continued for several months Also at times she would be feeling some palpitations She does not think she remembers any symptoms of unusual weight loss, sweating or heat intolerance  After her diagnosis of hyperthyroidism she was treated with PTU, initially higher doses and then subsequently this was reduced to apparently 50 mg twice daily by her endocrinologist No records are available from previous management She was told to continue the medication and was subjectively feeling quite well Her last evaluation with the endocrinologist was in September 2018 and no changes were made  In April 2019 patient started having joint pains in multiple joints Also she thinks she may have been starting to feel more tired Did not feel that she had any significant weight change, heat or cold intolerance at that time Also over the last 6 months has had a tendency to hair loss  She was evaluated by her PCP in June and her TSH was high she was told by her PCP to stop her PTU, TSH 11.6 and 2 days later was 6.5 On 06/19/2018 TSH was 5.17 with normal free T4  RECENT history:  Because of her complaints of fatigue and arthralgia along with a low free T4 level in 10/2018 she was started on levothyroxine 25 mcg daily Although she was recommended 50 mcg subsequently she felt some palpitations with this dosage  She is now taking 37.5 mcg levothyroxine since about 11/2018  The dose has been continued unchanged on her last visit in  7/20 With levothyroxine she has had improvement in her fatigue She feels fairly good and does not complain of tiredness, unusual fatigue or lethargy No change in her weight No cold intolerance She is not having any palpitations also  She is taking her thyroid supplement consistently every morning before breakfast    Her free T4 is improved compared to baseline and stable now  Wt Readings from Last 3 Encounters:  01/05/20 120 lb (54.4 kg)  03/03/19 117 lb 3.2 oz (53.2 kg)  12/02/18 117 lb 6.4 oz (53.3 kg)     Thyroid function tests as follows:     Lab Results  Component Value Date   FREET4 0.77 12/31/2019   FREET4 0.76 07/02/2019   FREET4 0.77 02/24/2019   T3FREE 3.8 02/24/2019   T3FREE 3.2 11/25/2018   TSH 2.29 12/31/2019   TSH 2.64 07/02/2019   TSH 1.23 02/24/2019    No results found for: THYROTRECAB   Allergies as of 01/05/2020   No Known Allergies  Medication List       Accurate as of January 05, 2020 10:07 AM. If you have any questions, ask your nurse or doctor.        Euthyrox 25 MCG tablet Generic drug: levothyroxine Take 25 mcg by mouth daily before breakfast. Take 1.5(37.5mg  total) tablets by mouth once daily. What changed: Another medication with the same name was removed. Continue taking this medication, and follow the directions you see here. Changed by: Elayne Snare, MD           Past Medical History:  Diagnosis Date  . Fibroids   . Thyroid disease     Past Surgical History:  Procedure Laterality Date  . AUGMENTATION MAMMAPLASTY Bilateral     Family History  Problem Relation Age of Onset  . Hyperlipidemia Mother   . Hypertension Mother   . Hyperlipidemia Father   . Hypertension Father   . Stroke Father   . Thyroid disease Maternal Grandmother        hypothroid  . Breast cancer Neg Hx     Social History:  reports that she has never smoked. She has never used smokeless tobacco. She reports previous alcohol use. She reports  previous drug use.  Allergies: No Known Allergies   Review of Systems  Has periodic hot flashes and night sweats, improving further  Has some joint pains in her hands, shoulders and some carpal tunnel symptoms    Examination:   BP 124/80 (BP Location: Left Arm, Patient Position: Sitting, Cuff Size: Normal)   Pulse 99   Ht 5\' 1"  (1.549 m)   Wt 120 lb (54.4 kg)   SpO2 98%   BMI 22.67 kg/m   Thyroid not palpable Biceps reflexes are normal    Assessment/Plan:  Mild secondary hypothyroidism:  She is doing consistently well with her energy level after starting supplementation of levothyroxine 37.5 mcg Initially started this in 11/19  Has been taking this morning as directed Free T4 is improved and unchanged  Since previously she had palpitations with 50 mcg dose he can continue the 37.5 mcg dose for now  Recommended taking a general calcium and vitamin D supplement since she is menopausal, and to avoid taking this at the same time as her thyroid supplement  Follow-up in 6 months  Aundreya Souffrant 01/05/2020, 10:07 AM    Note: This office note was prepared with Dragon voice recognition system technology. Any transcriptional errors that result from this process are unintentional.     Elayne Snare

## 2020-01-26 ENCOUNTER — Other Ambulatory Visit: Payer: Self-pay

## 2020-01-26 ENCOUNTER — Ambulatory Visit
Admission: RE | Admit: 2020-01-26 | Discharge: 2020-01-26 | Disposition: A | Discharge status: Home / Self Care | Payer: BC Managed Care – PPO | Source: Ambulatory Visit | Attending: Obstetrics and Gynecology | Admitting: Obstetrics and Gynecology

## 2020-01-26 DIAGNOSIS — Z1231 Encounter for screening mammogram for malignant neoplasm of breast: Secondary | ICD-10-CM

## 2020-05-17 DIAGNOSIS — H4423 Degenerative myopia, bilateral: Secondary | ICD-10-CM | POA: Diagnosis not present

## 2020-05-17 DIAGNOSIS — H04123 Dry eye syndrome of bilateral lacrimal glands: Secondary | ICD-10-CM | POA: Diagnosis not present

## 2020-05-17 DIAGNOSIS — H25013 Cortical age-related cataract, bilateral: Secondary | ICD-10-CM | POA: Diagnosis not present

## 2020-05-17 DIAGNOSIS — H40013 Open angle with borderline findings, low risk, bilateral: Secondary | ICD-10-CM | POA: Diagnosis not present

## 2020-06-28 ENCOUNTER — Other Ambulatory Visit: Payer: Self-pay

## 2020-06-28 ENCOUNTER — Other Ambulatory Visit (INDEPENDENT_AMBULATORY_CARE_PROVIDER_SITE_OTHER): Payer: BC Managed Care – PPO

## 2020-06-28 DIAGNOSIS — E038 Other specified hypothyroidism: Secondary | ICD-10-CM

## 2020-06-28 LAB — TSH: TSH: 2.04 u[IU]/mL (ref 0.35–4.50)

## 2020-06-28 LAB — T4, FREE: Free T4: 0.93 ng/dL (ref 0.60–1.60)

## 2020-07-05 ENCOUNTER — Other Ambulatory Visit: Payer: Self-pay

## 2020-07-05 ENCOUNTER — Encounter: Payer: Self-pay | Admitting: Endocrinology

## 2020-07-05 ENCOUNTER — Ambulatory Visit (INDEPENDENT_AMBULATORY_CARE_PROVIDER_SITE_OTHER): Payer: BC Managed Care – PPO | Admitting: Endocrinology

## 2020-07-05 VITALS — BP 118/78 | HR 68 | Ht 61.0 in | Wt 116.0 lb

## 2020-07-05 DIAGNOSIS — E038 Other specified hypothyroidism: Secondary | ICD-10-CM | POA: Diagnosis not present

## 2020-07-05 NOTE — Progress Notes (Signed)
Patient ID: Tamara Adkins, female   DOB: June 08, 1966, 54 y.o.   MRN: [id number]                                                                                                              Reason for Appointment: Follow-up of thyroid  Referring physician: Tamsen Roers    Chief complaint: Follow-up   History of Present Illness:   Prior history: Reportedly in 2004 the patient was diagnosed to have hyperthyroidism At that time she was having problems with frequent hives that continued for several months Also at times she would be feeling some palpitations She does not think she remembers any symptoms of unusual weight loss, sweating or heat intolerance  After her diagnosis of hyperthyroidism she was treated with PTU, initially higher doses and then subsequently this was reduced to apparently 50 mg twice daily by her endocrinologist No records are available from previous management She was told to continue the medication and was subjectively feeling quite well Her last evaluation with the endocrinologist was in September 2018 and no changes were made  In April 2019 patient started having joint pains in multiple joints Also she thinks she may have been starting to feel more tired Did not feel that she had any significant weight change, heat or cold intolerance at that time Also over the last 6 months has had a tendency to hair loss  She was evaluated by her PCP in June and her TSH was high she was told by her PCP to stop her PTU, TSH 11.6 and 2 days later was 6.5 On 06/19/2018 TSH was 5.17 with normal free T4  RECENT history:  Because of her complaints of fatigue and arthralgia along with a low free T4 level in 10/2018 she was started on levothyroxine 25 mcg daily Although she was recommended 50 mcg subsequently she felt some palpitations with this dosage With levothyroxine she has had improvement in her fatigue  She is now taking 37.5 mcg levothyroxine since about 11/2018  The dose  has been continued unchanged   She again feels fairly good and has not noticed her fatigue coming back although she still has some mild arthralgias Has no other complaints Her weight has fluctuated a little  She is taking her levothyroxine consistently every morning before breakfast Now getting Euthyrox   Her free T4 is improved compared to her last level at 0.9 TSH about the same as before and normal again  Wt Readings from Last 3 Encounters:  07/05/20 116 lb (52.6 kg)  01/05/20 120 lb (54.4 kg)  03/03/19 117 lb 3.2 oz (53.2 kg)     Thyroid function tests as follows:     Lab Results  Component Value Date   FREET4 0.93 06/28/2020   FREET4 0.77 12/31/2019   FREET4 0.76 07/02/2019   T3FREE 3.8 02/24/2019   T3FREE 3.2 11/25/2018   TSH 2.04 06/28/2020   TSH 2.29 12/31/2019   TSH 2.64 07/02/2019    No results found for: THYROTRECAB   Allergies as of  07/05/2020   No Known Allergies     Medication List       Accurate as of July 05, 2020 10:01 AM. If you have any questions, ask your nurse or doctor.        Euthyrox 25 MCG tablet Generic drug: levothyroxine Take 25 mcg by mouth daily before breakfast. Take 1.5(37.5mg  total) tablets by mouth once daily.           Past Medical History:  Diagnosis Date  . Fibroids   . Thyroid disease     Past Surgical History:  Procedure Laterality Date  . AUGMENTATION MAMMAPLASTY Bilateral     Family History  Problem Relation Age of Onset  . Hyperlipidemia Mother   . Hypertension Mother   . Hyperlipidemia Father   . Hypertension Father   . Stroke Father   . Thyroid disease Maternal Grandmother        hypothroid  . Breast cancer Neg Hx     Social History:  reports that she has never smoked. She has never used smokeless tobacco. She reports previous alcohol use. She reports previous drug use.  Allergies: No Known Allergies   Review of Systems  Has periodic hot flashes and night sweats, now only intermittent  She  was recommended a calcium and vitamin D supplement  She is going to discuss bone density screening with gynecologist, does not appear to have any risk factors or known family history  Has some joint pains in her hands, shoulders and some carpal tunnel symptoms    Examination:   BP 118/78 (BP Location: Left Arm, Patient Position: Sitting, Cuff Size: Normal)   Pulse 68   Ht 5\' 1"  (1.549 m)   Wt 116 lb (52.6 kg)   SpO2 97%   BMI 21.92 kg/m   Thyroid not palpable  Biceps reflexes are normal    Assessment/Plan:  Mild secondary hypothyroidism:  She has required only a small stable dose of levothyroxine 37.5 mcg since 10/2018 With this she has subjectively improved with her energy level Free T4 level is further improved  No change in dosage  Follow-up in 1 year  Elayne Snare 07/05/2020, 10:01 AM    Note: This office note was prepared with Dragon voice recognition system technology. Any transcriptional errors that result from this process are unintentional.     Elayne Snare

## 2020-07-21 DIAGNOSIS — Z124 Encounter for screening for malignant neoplasm of cervix: Secondary | ICD-10-CM | POA: Diagnosis not present

## 2020-07-21 DIAGNOSIS — Z01419 Encounter for gynecological examination (general) (routine) without abnormal findings: Secondary | ICD-10-CM | POA: Diagnosis not present

## 2020-07-21 DIAGNOSIS — Z6821 Body mass index (BMI) 21.0-21.9, adult: Secondary | ICD-10-CM | POA: Diagnosis not present

## 2020-07-26 LAB — HM PAP SMEAR: HPV, high-risk: NEGATIVE

## 2020-08-18 DIAGNOSIS — M25511 Pain in right shoulder: Secondary | ICD-10-CM | POA: Diagnosis not present

## 2020-08-21 DIAGNOSIS — Z20822 Contact with and (suspected) exposure to covid-19: Secondary | ICD-10-CM | POA: Diagnosis not present

## 2020-08-25 DIAGNOSIS — M25511 Pain in right shoulder: Secondary | ICD-10-CM | POA: Diagnosis not present

## 2020-09-24 ENCOUNTER — Other Ambulatory Visit: Payer: Self-pay | Admitting: Endocrinology

## 2020-09-26 MED ORDER — LEVOTHYROXINE SODIUM 25 MCG PO TABS
ORAL_TABLET | ORAL | 3 refills | Status: DC
Start: 2020-09-26 — End: 2020-09-28

## 2020-09-26 NOTE — Addendum Note (Signed)
Addended by: Amado Coe on: 09/26/2020 04:45 PM   Modules accepted: Orders

## 2020-09-28 ENCOUNTER — Other Ambulatory Visit: Payer: Self-pay | Admitting: Endocrinology

## 2020-12-29 ENCOUNTER — Other Ambulatory Visit: Payer: Self-pay | Admitting: Obstetrics and Gynecology

## 2020-12-29 DIAGNOSIS — Z1231 Encounter for screening mammogram for malignant neoplasm of breast: Secondary | ICD-10-CM

## 2021-02-09 ENCOUNTER — Other Ambulatory Visit: Payer: Self-pay

## 2021-02-09 ENCOUNTER — Ambulatory Visit
Admission: RE | Admit: 2021-02-09 | Discharge: 2021-02-09 | Disposition: A | Discharge status: Home / Self Care | Payer: BC Managed Care – PPO | Source: Ambulatory Visit | Attending: Obstetrics and Gynecology | Admitting: Obstetrics and Gynecology

## 2021-02-09 DIAGNOSIS — Z1231 Encounter for screening mammogram for malignant neoplasm of breast: Secondary | ICD-10-CM

## 2021-03-14 ENCOUNTER — Telehealth: Payer: Self-pay | Admitting: Endocrinology

## 2021-03-14 NOTE — Telephone Encounter (Signed)
Patient called to advise that her new pharmacy of choice is [facility] [address].  Needs a new RX for Levothyroxine 25 mcg to be sent for her (per pharmacy)  Call [phone]

## 2021-03-15 ENCOUNTER — Other Ambulatory Visit: Payer: Self-pay | Admitting: *Deleted

## 2021-03-15 NOTE — Telephone Encounter (Signed)
Pt stated ---just need to add new pharmacy for future refills. [facility]

## 2021-03-28 ENCOUNTER — Other Ambulatory Visit: Payer: Self-pay | Admitting: *Deleted

## 2021-03-28 ENCOUNTER — Telehealth: Payer: Self-pay | Admitting: Endocrinology

## 2021-03-28 MED ORDER — LEVOTHYROXINE SODIUM 25 MCG PO TABS: ORAL_TABLET | ORAL | 1 refills | Status: DC

## 2021-03-28 NOTE — Telephone Encounter (Signed)
Rx sent 

## 2021-03-28 NOTE — Telephone Encounter (Signed)
MEDICATION: Levothyroxine  PHARMACY:  [facility] [address]  HAS THE PATIENT CONTACTED THEIR PHARMACY?  New pharmacy   IS THIS A 90 DAY SUPPLY : 90 days  IS PATIENT OUT OF MEDICATION: unknown  IF NOT; HOW MUCH IS LEFT:   LAST APPOINTMENT DATE: @4 /04/2021  NEXT APPOINTMENT DATE:@7 /25/2022  DO WE HAVE YOUR PERMISSION TO LEAVE A DETAILED MESSAGE?: yes  OTHER COMMENTS:    **Let patient know to contact pharmacy at the end of the day to make sure medication is ready. **  ** Please notify patient to allow 48-72 hours to process**  **Encourage patient to contact the pharmacy for refills or they can request refills through [facility]**

## 2021-04-04 DIAGNOSIS — Z1211 Encounter for screening for malignant neoplasm of colon: Secondary | ICD-10-CM | POA: Diagnosis not present

## 2021-04-04 DIAGNOSIS — Z8249 Family history of ischemic heart disease and other diseases of the circulatory system: Secondary | ICD-10-CM | POA: Diagnosis not present

## 2021-04-04 DIAGNOSIS — E538 Deficiency of other specified B group vitamins: Secondary | ICD-10-CM | POA: Diagnosis not present

## 2021-04-04 DIAGNOSIS — E119 Type 2 diabetes mellitus without complications: Secondary | ICD-10-CM | POA: Diagnosis not present

## 2021-04-04 DIAGNOSIS — E063 Autoimmune thyroiditis: Secondary | ICD-10-CM | POA: Diagnosis not present

## 2021-04-04 DIAGNOSIS — D529 Folate deficiency anemia, unspecified: Secondary | ICD-10-CM | POA: Diagnosis not present

## 2021-04-04 DIAGNOSIS — R7989 Other specified abnormal findings of blood chemistry: Secondary | ICD-10-CM | POA: Diagnosis not present

## 2021-04-04 DIAGNOSIS — E559 Vitamin D deficiency, unspecified: Secondary | ICD-10-CM | POA: Diagnosis not present

## 2021-04-11 ENCOUNTER — Other Ambulatory Visit: Payer: Self-pay

## 2021-04-11 ENCOUNTER — Ambulatory Visit: Payer: BC Managed Care – PPO | Admitting: Cardiology

## 2021-04-11 ENCOUNTER — Encounter: Payer: Self-pay | Admitting: Cardiology

## 2021-04-11 VITALS — BP 130/83 | HR 77 | Temp 98.2°F | Resp 17 | Ht 61.0 in | Wt 119.8 lb

## 2021-04-11 DIAGNOSIS — Z8249 Family history of ischemic heart disease and other diseases of the circulatory system: Secondary | ICD-10-CM

## 2021-04-11 DIAGNOSIS — E7800 Pure hypercholesterolemia, unspecified: Secondary | ICD-10-CM

## 2021-04-11 NOTE — Progress Notes (Signed)
Primary Physician/Referring:  Tamsen Roers, MD  Patient ID: Tamara Adkins, female    DOB: Mar 25, 1966, 55 y.o.   MRN: [id number]  Chief Complaint  Patient presents with  . New Patient (Initial Visit)  . hx of MI    Ref by Luna Glasgow   HPI:    Tamara Adkins  is a 55 y.o. 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  There is no immediate family members, i.e. parents that have premature coronary disease or sudden cardiac death.  Her brother died at a young age and 66s with motor vehicle accident.  Except for elevated lipids, she has no risk factors, however because of multiple relatives having had coronary when she wanted to be further stratified.  She continues to exercise at least 2 days a week by brisk walking and Zumba and also continues remain active at home and around the house and has no symptoms.  Past Medical History:  Diagnosis Date  . Fibroids   . Thyroid disease    Past Surgical History:  Procedure Laterality Date  . AUGMENTATION MAMMAPLASTY Bilateral    Family History  Problem Relation Age of Onset  . Hyperlipidemia Mother   . Hypertension Mother   . Hyperlipidemia Father   . Hypertension Father   . Stroke Father   . Thyroid disease Maternal Grandmother        hypothroid  . Other Brother Educational psychologist  . Breast cancer Neg Hx     Social History   Tobacco Use  . Smoking status: Never Smoker  . Smokeless tobacco: Never Used  Substance Use Topics  . Alcohol use: Not Currently   Marital Status: Married  ROS  Review of Systems  Cardiovascular: Negative for chest pain, dyspnea on exertion and leg swelling.  Gastrointestinal: Negative for melena.   Objective  Blood pressure 130/83, pulse 77, temperature 98.2 F (36.8 C), temperature source Temporal, resp. rate 17, height 5' 1"  (1.549 m), weight 119 lb 12.8 oz  (54.3 kg), last menstrual period 04/18/2018, SpO2 95 %. Body mass index is 22.64 kg/m.  Vitals with BMI 04/11/2021 07/05/2020 01/05/2020  Height 5' 1"  5' 1"  5' 1"   Weight 119 lbs 13 oz 116 lbs 120 lbs  BMI 22.65 74.12 87.86  Systolic 767 209 470  Diastolic 83 78 80  Pulse 77 68 99     Physical Exam  Constitutional: No distress.  Eyes: Conjunctivae are normal.  Neck: No JVD present.  Cardiovascular: Regular rhythm, normal heart sounds, intact distal pulses and normal pulses. Exam reveals no gallop.  No murmur heard. Pulmonary/Chest: Effort normal and breath sounds normal. She exhibits no tenderness.  Abdominal: Soft. Bowel sounds are normal.  Musculoskeletal:        General: No edema. Normal range of motion.     Cervical back: Neck supple.  Neurological: She is alert and oriented to person, place, and time.  Skin: Skin is warm.     Laboratory examination:   Recent Labs    06/28/20 1024  TSH 2.04    External labs:   Labs 04/05/2021:  Total cholesterol 229, triglycerides 79, HDL 70, LDL 141.  Non-HDL cholesterol 159.  BUN 16, creatinine 0.90, EGFR 72 mL, potassium 4.2, CMP otherwise normal.  Hb 12.5/HCT 37.5, platelets 255.  Normal indicis.  A1c 5.3%.  Medications and allergies  No Known Allergies   Current Outpatient Medications on File Prior to Visit  Medication Sig Dispense Refill  . levothyroxine (SYNTHROID) 25 MCG tablet Take 1 1/2 tablets daily before breakfast 135 tablet 1   No current facility-administered medications on file prior to visit.     Radiology:   No results found.  Cardiac Studies:   None EKG:     EKG 04/11/2021: Normal sinus rhythm at rate of 77 bpm, normal axis. IRBBB. No evidence of ischemia, normal EKG.     Assessment     ICD-10-CM   1. Family history of MI (myocardial infarction)  Z82.49 EKG 12-Lead    CT CARDIAC SCORING (SELF PAY ONLY)  2. Hypercholesteremia  E78.00      There are no discontinued medications.  No orders of  the defined types were placed in this encounter.  Orders Placed This Encounter  Procedures  . CT CARDIAC SCORING (SELF PAY ONLY)    Standing Status:   Future    Standing Expiration Date:   06/11/2021    Order Specific Question:   Preferred imaging location?    Answer:   [facility]    Order Specific Question:   Radiology Contrast Protocol - do NOT remove file path    Answer:   [email or web address]    Order Specific Question:   Is patient pregnant?    Answer:   No  . EKG 12-Lead   Recommendations:   Tamara Adkins is a 55 y.o. Caucasian female with no significant cardiovascular risk factors except for hyperlipidemia, no immediate family members with known coronary disease or sudden cardiac death but has multiple relatives with cardiac events hence wanted to be evaluated further.  I reviewed her external records, LDL is certainly elevated but not to a high degree that we should start therapy at this point in the absence of any other risk factors including tobacco use, hypertension, obesity or strong family history of premature coronary disease.  I have recommended coronary calcium score.  Depending upon this we can make decision whether she needs to be on statin therapy.  Otherwise would not recommend any stress testing or echocardiogram, I have extensively reviewed primary prevention strategy with the patient.  She continues remain active, continues to exercise fairly regularly and is at ideal body weight as well.  Unless coronary calcium score is markedly abnormal, I will see her back on a as needed basis.  I will update her once the results become available to me.    Adrian Prows, MD, [facility] 04/11/2021, 9:41 AM Office: [phone]

## 2021-04-12 ENCOUNTER — Other Ambulatory Visit: Payer: Self-pay | Admitting: Student

## 2021-04-27 ENCOUNTER — Ambulatory Visit
Admission: RE | Admit: 2021-04-27 | Discharge: 2021-04-27 | Disposition: A | Discharge status: Home / Self Care | Payer: Self-pay | Source: Ambulatory Visit | Attending: Student | Admitting: Student

## 2021-05-03 NOTE — Progress Notes (Signed)
Please inform patient her coronary calcium score is 0. We will see her on an as needed basis. Also please let her know there was a nodule noted on her lung, no follow up recommended at this point, but please advise her to follow up with PCP regarding this.   Please also forward a copy of results to her PCP.

## 2021-05-03 NOTE — Progress Notes (Signed)
Called patient, NA, LMAM

## 2021-05-10 NOTE — Progress Notes (Signed)
Called and spoke to pt regarding test results. Pt voiced understanding. Sending test results to PCP.

## 2021-05-18 DIAGNOSIS — H4423 Degenerative myopia, bilateral: Secondary | ICD-10-CM | POA: Diagnosis not present

## 2021-05-18 DIAGNOSIS — H40013 Open angle with borderline findings, low risk, bilateral: Secondary | ICD-10-CM | POA: Diagnosis not present

## 2021-05-18 DIAGNOSIS — H25013 Cortical age-related cataract, bilateral: Secondary | ICD-10-CM | POA: Diagnosis not present

## 2021-05-18 DIAGNOSIS — H04123 Dry eye syndrome of bilateral lacrimal glands: Secondary | ICD-10-CM | POA: Diagnosis not present

## 2021-07-04 ENCOUNTER — Other Ambulatory Visit (INDEPENDENT_AMBULATORY_CARE_PROVIDER_SITE_OTHER): Payer: No Typology Code available for payment source

## 2021-07-04 ENCOUNTER — Other Ambulatory Visit: Payer: Self-pay

## 2021-07-04 DIAGNOSIS — E038 Other specified hypothyroidism: Secondary | ICD-10-CM

## 2021-07-04 LAB — T4, FREE: Free T4: 0.87 ng/dL (ref 0.60–1.60)

## 2021-07-04 LAB — TSH: TSH: 2.54 u[IU]/mL (ref 0.35–5.50)

## 2021-07-06 ENCOUNTER — Ambulatory Visit: Payer: BC Managed Care – PPO | Admitting: Endocrinology

## 2021-07-11 ENCOUNTER — Other Ambulatory Visit: Payer: Self-pay

## 2021-07-11 ENCOUNTER — Ambulatory Visit (INDEPENDENT_AMBULATORY_CARE_PROVIDER_SITE_OTHER): Payer: BC Managed Care – PPO | Admitting: Endocrinology

## 2021-07-11 ENCOUNTER — Encounter: Payer: Self-pay | Admitting: Endocrinology

## 2021-07-11 VITALS — BP 120/72 | HR 81 | Ht 61.5 in | Wt 116.4 lb

## 2021-07-11 DIAGNOSIS — E038 Other specified hypothyroidism: Secondary | ICD-10-CM

## 2021-07-11 NOTE — Progress Notes (Signed)
Patient ID: Tamara Adkins, female   DOB: 1966-01-28, 55 y.o.   MRN: [id number]                                                                                                              Reason for Appointment: Follow-up of thyroid  Referring physician: Tamsen Roers    Chief complaint: Follow-up   History of Present Illness:   Prior history: Reportedly in 2004 the patient was diagnosed to have hyperthyroidism At that time she was having problems with frequent hives that continued for several months Also at times she would be feeling some palpitations She does not think she remembers any symptoms of unusual weight loss, sweating or heat intolerance  After her diagnosis of hyperthyroidism she was treated with PTU, initially higher doses and then subsequently this was reduced to apparently 50 mg twice daily by her endocrinologist No records are available from previous management She was told to continue the medication and was subjectively feeling quite well Her last evaluation with the endocrinologist was in September 2018 and no changes were made  In April 2019 patient started having joint pains in multiple joints Also she thinks she may have been starting to feel more tired Did not feel that she had any significant weight change, heat or cold intolerance at that time Also over the last 6 months has had a tendency to hair loss  She was evaluated by her PCP in June and her TSH was high she was told by her PCP to stop her PTU, TSH 11.6 and 2 days later was 6.5 On 06/19/2018 TSH was 5.17 with normal free T4  RECENT history:  Because of her complaints of fatigue and arthralgia along with a low free T4 level in 10/2018 she was started on levothyroxine 25 mcg daily Although she was recommended 50 mcg subsequently she felt some palpitations with this dosage With levothyroxine she has had improvement in her fatigue  She is taking 37.5 mcg levothyroxine since about 11/2018  The dose has  been continued unchanged   She does not complain of any joint pains or fatigue now Weight is about the same as last year when she was last seen  She is taking her levothyroxine consistently every morning before breakfast Now getting generic   Her free T4 is minimally lower but TSH is about the same   Wt Readings from Last 3 Encounters:  07/11/21 116 lb 6.4 oz (52.8 kg)  04/11/21 119 lb 12.8 oz (54.3 kg)  07/05/20 116 lb (52.6 kg)     Thyroid function tests as follows:     Lab Results  Component Value Date   FREET4 0.87 07/04/2021   FREET4 0.93 06/28/2020   FREET4 0.77 12/31/2019   T3FREE 3.8 02/24/2019   T3FREE 3.2 11/25/2018   TSH 2.54 07/04/2021   TSH 2.04 06/28/2020   TSH 2.29 12/31/2019    No results found for: THYROTRECAB   Allergies as of 07/11/2021   No Known Allergies      Medication List  Accurate as of July 11, 2021  1:50 PM. If you have any questions, ask your nurse or doctor.          levothyroxine 25 MCG tablet Commonly known as: SYNTHROID Take 1 1/2 tablets daily before breakfast            Past Medical History:  Diagnosis Date   Fibroids    Thyroid disease     Past Surgical History:  Procedure Laterality Date   AUGMENTATION MAMMAPLASTY Bilateral     Family History  Problem Relation Age of Onset   Hyperlipidemia Mother    Hypertension Mother    Hyperlipidemia Father    Hypertension Father    Stroke Father    Thyroid disease Maternal Grandmother        hypothroid   Other Brother 32       Car Accident   Breast cancer Neg Hx     Social History:  reports that she has never smoked. She has never used smokeless tobacco. She reports previous alcohol use. She reports previous drug use.  Allergies: No Known Allergies   Review of Systems  Has periodic hot flashes and night sweats, now only mild and occasional  She was recommended a calcium and vitamin D supplement but has not taken any  She has mild  hypercholesterolemia    Examination:   BP 120/72   Pulse 81   Ht 5' 1.5" (1.562 m)   Wt 116 lb 6.4 oz (52.8 kg)   LMP 04/18/2018 (Exact Date)   SpO2 96%   BMI 21.64 kg/m     Assessment/Plan:  Mild secondary hypothyroidism:  She has required only a small stable dose of levothyroxine 37.5 mcg since 10/2018 With this she has been feeling consistently better and also has less joint pains Free T4 is quite normal  Since she is subjectively doing very well she can continue the small dose of 37.5 mcg as before  Again recommended that she should take vitamin D with calcium regularly as a supplement preferably at lunch or dinner  Follow-up in 1 year  Elayne Snare 07/11/2021, 1:50 PM    Note: This office note was prepared with Dragon voice recognition system technology. Any transcriptional errors that result from this process are unintentional.     Elayne Snare

## 2021-07-11 NOTE — Patient Instructions (Signed)
Take thyroid in ams

## 2021-08-17 DIAGNOSIS — Z01419 Encounter for gynecological examination (general) (routine) without abnormal findings: Secondary | ICD-10-CM | POA: Diagnosis not present

## 2021-10-03 ENCOUNTER — Other Ambulatory Visit: Payer: Self-pay | Admitting: Endocrinology

## 2021-11-09 DIAGNOSIS — H00016 Hordeolum externum left eye, unspecified eyelid: Secondary | ICD-10-CM | POA: Diagnosis not present

## 2022-01-16 ENCOUNTER — Other Ambulatory Visit: Payer: Self-pay | Admitting: Obstetrics and Gynecology

## 2022-01-16 DIAGNOSIS — Z1231 Encounter for screening mammogram for malignant neoplasm of breast: Secondary | ICD-10-CM

## 2022-02-13 ENCOUNTER — Ambulatory Visit
Admission: RE | Admit: 2022-02-13 | Discharge: 2022-02-13 | Disposition: A | Discharge status: Home / Self Care | Payer: BC Managed Care – PPO | Source: Ambulatory Visit | Attending: Obstetrics and Gynecology | Admitting: Obstetrics and Gynecology

## 2022-02-13 DIAGNOSIS — Z1231 Encounter for screening mammogram for malignant neoplasm of breast: Secondary | ICD-10-CM

## 2022-03-08 DIAGNOSIS — M79671 Pain in right foot: Secondary | ICD-10-CM | POA: Diagnosis not present

## 2022-03-08 DIAGNOSIS — M21619 Bunion of unspecified foot: Secondary | ICD-10-CM | POA: Diagnosis not present

## 2022-03-08 DIAGNOSIS — M79672 Pain in left foot: Secondary | ICD-10-CM | POA: Diagnosis not present

## 2022-03-09 ENCOUNTER — Other Ambulatory Visit (HOSPITAL_COMMUNITY): Payer: Self-pay | Admitting: Orthopedic Surgery

## 2022-04-07 ENCOUNTER — Other Ambulatory Visit: Payer: Self-pay | Admitting: Endocrinology

## 2022-04-10 ENCOUNTER — Ambulatory Visit (INDEPENDENT_AMBULATORY_CARE_PROVIDER_SITE_OTHER): Payer: BC Managed Care – PPO | Admitting: Nurse Practitioner

## 2022-04-10 ENCOUNTER — Encounter: Payer: Self-pay | Admitting: Nurse Practitioner

## 2022-04-10 VITALS — BP 128/77 | HR 84 | Temp 97.5°F | Ht 62.0 in | Wt 120.0 lb

## 2022-04-10 DIAGNOSIS — Z1211 Encounter for screening for malignant neoplasm of colon: Secondary | ICD-10-CM | POA: Insufficient documentation

## 2022-04-10 DIAGNOSIS — E039 Hypothyroidism, unspecified: Secondary | ICD-10-CM | POA: Insufficient documentation

## 2022-04-10 DIAGNOSIS — M21619 Bunion of unspecified foot: Secondary | ICD-10-CM

## 2022-04-10 DIAGNOSIS — Z7689 Persons encountering health services in other specified circumstances: Secondary | ICD-10-CM | POA: Diagnosis not present

## 2022-04-10 NOTE — Patient Instructions (Signed)

## 2022-04-10 NOTE — Progress Notes (Signed)
? ?New Patient Office Visit ? ?Subjective   ? ?Patient ID: Tamara Adkins, female    DOB: November 23, 1966  Age: 56 y.o. MRN: [id number] ? ?CC:  ?Chief Complaint  ?Patient presents with  ? New Patient (Initial Visit)  ? Establish Care  ? ? ?HPI ?Tamara Adkins presents to establish care ?-She is coming from another PCP in the area whose office is getting ready to or has closed.  ?-sees endocrinology for her thyroid. Going once yearly.  ?-she does see GYN provider yearly for well woman care and mammograms.  ?-denies concerns or complaints.  ?-scheduled to have right bunionectomy in June 2023. Bunions are pretty significant and already painful.  ? ? ?Outpatient Encounter Medications as of 04/10/2022  ?Medication Sig  ? levothyroxine (SYNTHROID) 25 MCG tablet TAKE 1 &1/2 TABLETS DAILY BEFORE BREAKFAST  ? ?No facility-administered encounter medications on file as of 04/10/2022.  ? ? ?Past Medical History:  ?Diagnosis Date  ? Fibroids   ? Thyroid disease   ? ? ?Past Surgical History:  ?Procedure Laterality Date  ? AUGMENTATION MAMMAPLASTY Bilateral   ? ? ?Family History  ?Problem Relation Age of Onset  ? Hyperlipidemia Mother   ? Hypertension Mother   ? Hyperlipidemia Father   ? Hypertension Father   ? Stroke Father   ? Other Brother 59  ?     Car Accident  ? Heart attack Maternal Grandmother   ? Thyroid disease Maternal Grandmother   ?     hypothroid  ? Heart attack Paternal Grandmother   ? Breast cancer Neg Hx   ? ? ?Social History  ? ?Socioeconomic History  ? Marital status: Married  ?  Spouse name: Not on file  ? Number of children: 1  ? Years of education: Not on file  ? Highest education level: Not on file  ?Occupational History  ? Occupation: Hairdresser  ?Tobacco Use  ? Smoking status: Never  ? Smokeless tobacco: Never  ?Vaping Use  ? Vaping Use: Never used  ?Substance and Sexual Activity  ? Alcohol use: Not Currently  ? Drug use: Not Currently  ? Sexual activity: Yes  ?  Partners: Male  ?Other Topics Concern  ? Not on file   ?Social History Narrative  ? Not on file  ? ?Social Determinants of Health  ? ?Financial Resource Strain: Low Risk   ? Difficulty of Paying Living Expenses: Not very hard  ?Food Insecurity: No Food Insecurity  ? Worried About Charity fundraiser in the Last Year: Never true  ? Ran Out of Food in the Last Year: Never true  ?Transportation Needs: No Transportation Needs  ? Lack of Transportation (Medical): No  ? Lack of Transportation (Non-Medical): No  ?Physical Activity: Sufficiently Active  ? Days of Exercise per Week: 3 days  ? Minutes of Exercise per Session: 60 min  ?Stress: No Stress Concern Present  ? Feeling of Stress : Only a little  ?Social Connections: Socially Integrated  ? Frequency of Communication with Friends and Family: More than three times a week  ? Frequency of Social Gatherings with Friends and Family: More than three times a week  ? Attends Religious Services: More than 4 times per year  ? Active Member of Clubs or Organizations: Yes  ? Attends Archivist Meetings: 1 to 4 times per year  ? Marital Status: Married  ?Intimate Partner Violence: Not At Risk  ? Fear of Current or Ex-Partner: No  ? Emotionally Abused: No  ?  Physically Abused: No  ? Sexually Abused: No  ? ? ?Review of Systems  ?Constitutional:  Negative for chills, fever and malaise/fatigue.  ?HENT:  Negative for congestion, sinus pain and sore throat.   ?Eyes: Negative.   ?Respiratory:  Negative for cough, shortness of breath and wheezing.   ?Cardiovascular:  Negative for chest pain, palpitations and leg swelling.  ?Gastrointestinal:  Negative for constipation, diarrhea, nausea and vomiting.  ?Genitourinary: Negative.   ?Musculoskeletal:  Negative for myalgias.  ?Skin: Negative.   ?Neurological:  Negative for dizziness and headaches.  ?Endo/Heme/Allergies:  Does not bruise/bleed easily.  ?     Sees endocrinology for hypothyroid - initially started with graves disease   ?Psychiatric/Behavioral:  Negative for depression. The  patient is not nervous/anxious.   ? ?  ? ? ?Objective   ? ?Today's Vitals  ? 04/10/22 1024  ?BP: 128/77  ?Pulse: 84  ?Temp: (!) 97.5 ?F (36.4 ?C)  ?SpO2: 99%  ?Weight: 120 lb (54.4 kg)  ?Height: '5\' 2"'$  (1.575 m)  ? ?Body mass index is 21.95 kg/m?.  ? ?Physical Exam ?Vitals and nursing note reviewed.  ?Constitutional:   ?   Appearance: Normal appearance. She is well-developed.  ?HENT:  ?   Head: Normocephalic and atraumatic.  ?Eyes:  ?   Pupils: Pupils are equal, round, and reactive to light.  ?Cardiovascular:  ?   Rate and Rhythm: Normal rate and regular rhythm.  ?   Pulses: Normal pulses.  ?   Heart sounds: Normal heart sounds.  ?Pulmonary:  ?   Effort: Pulmonary effort is normal.  ?   Breath sounds: Normal breath sounds.  ?Abdominal:  ?   Palpations: Abdomen is soft.  ?Musculoskeletal:     ?   General: Normal range of motion.  ?   Cervical back: Normal range of motion and neck supple.  ?Lymphadenopathy:  ?   Cervical: No cervical adenopathy.  ?Skin: ?   General: Skin is warm and dry.  ?   Capillary Refill: Capillary refill takes less than 2 seconds.  ?Neurological:  ?   General: No focal deficit present.  ?   Mental Status: She is alert and oriented to person, place, and time.  ?Psychiatric:     ?   Mood and Affect: Mood normal.     ?   Behavior: Behavior normal.     ?   Thought Content: Thought content normal.     ?   Judgment: Judgment normal.  ? ? ?  ? ?Assessment & Plan:  ?1. Acquired hypothyroidism ?Thyroid stable and managed per endocrinology.  ? ?2. Bunion ?Patient scheduled for bunionectomy in June.  ? ?3. Encounter to establish care ?Appointment today to establish new primary care provider. Will get records from GYN and GI to review and update patient chart . ? ?  ?Problem List Items Addressed This Visit   ? ?  ? Endocrine  ? Acquired hypothyroidism - Primary  ?  ? Musculoskeletal and Integument  ? Bunion  ? ?Other Visit Diagnoses   ? ? Encounter to establish care      ? ?  ? ? ?Return in about 6 months  (around 10/11/2022), or had colonoscopy 6 years ago - ca we get results - also notes from GYN. ty, for CPE, FBW a week prior to visit (no TSH).  ? ?Ronnell Freshwater, NP ? ? ?

## 2022-05-23 ENCOUNTER — Other Ambulatory Visit: Payer: Self-pay

## 2022-05-23 ENCOUNTER — Encounter (HOSPITAL_BASED_OUTPATIENT_CLINIC_OR_DEPARTMENT_OTHER): Payer: Self-pay | Admitting: Orthopedic Surgery

## 2022-05-24 DIAGNOSIS — H2513 Age-related nuclear cataract, bilateral: Secondary | ICD-10-CM | POA: Diagnosis not present

## 2022-05-24 DIAGNOSIS — H04123 Dry eye syndrome of bilateral lacrimal glands: Secondary | ICD-10-CM | POA: Diagnosis not present

## 2022-05-24 DIAGNOSIS — H40013 Open angle with borderline findings, low risk, bilateral: Secondary | ICD-10-CM | POA: Diagnosis not present

## 2022-05-24 DIAGNOSIS — H4423 Degenerative myopia, bilateral: Secondary | ICD-10-CM | POA: Diagnosis not present

## 2022-05-31 NOTE — Anesthesia Preprocedure Evaluation (Addendum)
Anesthesia Evaluation  Patient identified by MRN, date of birth, ID band Patient awake    Reviewed: Allergy & Precautions, H&P , NPO status , Patient's Chart, lab work & pertinent test results  History of Anesthesia Complications (+) PONV and history of anesthetic complications  Airway Mallampati: II  TM Distance: >3 FB Neck ROM: Full    Dental no notable dental hx. (+) Teeth Intact, Dental Advisory Given   Pulmonary neg pulmonary ROS,    Pulmonary exam normal breath sounds clear to auscultation       Cardiovascular Exercise Tolerance: Good negative cardio ROS Normal cardiovascular exam Rhythm:Regular Rate:Normal     Neuro/Psych negative neurological ROS  negative psych ROS   GI/Hepatic negative GI ROS, Neg liver ROS,   Endo/Other  negative endocrine ROSHypothyroidism   Renal/GU negative Renal ROS  negative genitourinary   Musculoskeletal negative musculoskeletal ROS (+) Arthritis , Osteoarthritis,    Abdominal   Peds negative pediatric ROS (+)  Hematology negative hematology ROS (+)   Anesthesia Other Findings   Reproductive/Obstetrics negative OB ROS                            Anesthesia Physical Anesthesia Plan  ASA: 2  Anesthesia Plan: General   Post-op Pain Management: Regional block*   Induction: Intravenous  PONV Risk Score and Plan: 3 and Ondansetron, Dexamethasone, Midazolam, Propofol infusion and Scopolamine patch - Pre-op  Airway Management Planned: LMA  Additional Equipment: None  Intra-op Plan:   Post-operative Plan: Extubation in OR  Informed Consent: I have reviewed the patients History and Physical, chart, labs and discussed the procedure including the risks, benefits and alternatives for the proposed anesthesia with the patient or authorized representative who has indicated his/her understanding and acceptance.       Plan Discussed with:  Anesthesiologist and CRNA  Anesthesia Plan Comments: (  )       Anesthesia Quick Evaluation

## 2022-06-01 ENCOUNTER — Encounter (HOSPITAL_BASED_OUTPATIENT_CLINIC_OR_DEPARTMENT_OTHER)
Admission: RE | Disposition: A | Discharge status: Home / Self Care | Payer: Self-pay | Source: Home / Self Care | Attending: Orthopedic Surgery

## 2022-06-01 ENCOUNTER — Ambulatory Visit (HOSPITAL_BASED_OUTPATIENT_CLINIC_OR_DEPARTMENT_OTHER): Payer: BC Managed Care – PPO | Admitting: Anesthesiology

## 2022-06-01 ENCOUNTER — Other Ambulatory Visit: Payer: Self-pay

## 2022-06-01 ENCOUNTER — Ambulatory Visit (HOSPITAL_BASED_OUTPATIENT_CLINIC_OR_DEPARTMENT_OTHER)
Admission: RE | Admit: 2022-06-01 | Discharge: 2022-06-01 | Disposition: A | Discharge status: Home / Self Care | Payer: BC Managed Care – PPO | Attending: Orthopedic Surgery | Admitting: Orthopedic Surgery

## 2022-06-01 ENCOUNTER — Encounter (HOSPITAL_BASED_OUTPATIENT_CLINIC_OR_DEPARTMENT_OTHER): Payer: Self-pay | Admitting: Orthopedic Surgery

## 2022-06-01 ENCOUNTER — Ambulatory Visit (HOSPITAL_BASED_OUTPATIENT_CLINIC_OR_DEPARTMENT_OTHER): Payer: BC Managed Care – PPO

## 2022-06-01 DIAGNOSIS — M2011 Hallux valgus (acquired), right foot: Secondary | ICD-10-CM | POA: Insufficient documentation

## 2022-06-01 DIAGNOSIS — G8918 Other acute postprocedural pain: Secondary | ICD-10-CM | POA: Diagnosis not present

## 2022-06-01 DIAGNOSIS — M21611 Bunion of right foot: Secondary | ICD-10-CM | POA: Insufficient documentation

## 2022-06-01 DIAGNOSIS — E039 Hypothyroidism, unspecified: Secondary | ICD-10-CM | POA: Insufficient documentation

## 2022-06-01 DIAGNOSIS — M21619 Bunion of unspecified foot: Secondary | ICD-10-CM

## 2022-06-01 DIAGNOSIS — M79671 Pain in right foot: Secondary | ICD-10-CM | POA: Diagnosis not present

## 2022-06-01 HISTORY — DX: Hypothyroidism, unspecified: E03.9

## 2022-06-01 HISTORY — DX: Nausea with vomiting, unspecified: R11.2

## 2022-06-01 HISTORY — PX: BUNIONECTOMY: SHX129

## 2022-06-01 HISTORY — DX: Nausea with vomiting, unspecified: Z98.890

## 2022-06-01 SURGERY — BUNIONECTOMY
Anesthesia: General | Site: Foot | Laterality: Right

## 2022-06-01 MED ORDER — ONDANSETRON HCL 4 MG/2ML IJ SOLN
INTRAMUSCULAR | Status: AC
  Filled 2022-06-01: qty 2

## 2022-06-01 MED ORDER — FENTANYL CITRATE (PF) 100 MCG/2ML IJ SOLN
INTRAMUSCULAR | Status: DC | PRN
Start: 2022-06-01 — End: 2022-06-01
  Administered 2022-06-01: 25 ug via INTRAVENOUS

## 2022-06-01 MED ORDER — MEPERIDINE HCL 25 MG/ML IJ SOLN: 6.2500 mg | INTRAMUSCULAR | Status: DC | PRN

## 2022-06-01 MED ORDER — CEFAZOLIN SODIUM-DEXTROSE 2-4 GM/100ML-% IV SOLN: 2.0000 g | INTRAVENOUS | Status: DC

## 2022-06-01 MED ORDER — 0.9 % SODIUM CHLORIDE (POUR BTL) OPTIME
TOPICAL | Status: DC | PRN
  Administered 2022-06-01: 500 mL

## 2022-06-01 MED ORDER — BUPIVACAINE-EPINEPHRINE (PF) 0.5% -1:200000 IJ SOLN
INTRAMUSCULAR | Status: AC
  Filled 2022-06-01: qty 30

## 2022-06-01 MED ORDER — OXYCODONE HCL 5 MG/5ML PO SOLN: 5.0000 mg | Freq: Once | ORAL | Status: DC | PRN

## 2022-06-01 MED ORDER — VANCOMYCIN HCL 500 MG IV SOLR
INTRAVENOUS | Status: AC
  Filled 2022-06-01: qty 10

## 2022-06-01 MED ORDER — ROPIVACAINE HCL 5 MG/ML IJ SOLN
INTRAMUSCULAR | Status: DC | PRN
  Administered 2022-06-01: 15 mL via PERINEURAL

## 2022-06-01 MED ORDER — EPHEDRINE SULFATE (PRESSORS) 50 MG/ML IJ SOLN
INTRAMUSCULAR | Status: DC | PRN
  Administered 2022-06-01 (×3): 5 mg via INTRAVENOUS

## 2022-06-01 MED ORDER — ONDANSETRON HCL 4 MG/2ML IJ SOLN: 4.0000 mg | Freq: Once | INTRAMUSCULAR | Status: DC | PRN

## 2022-06-01 MED ORDER — DEXAMETHASONE SODIUM PHOSPHATE 10 MG/ML IJ SOLN
INTRAMUSCULAR | Status: DC | PRN
  Administered 2022-06-01: 10 mg via INTRAVENOUS

## 2022-06-01 MED ORDER — BUPIVACAINE LIPOSOME 1.3 % IJ SUSP
INTRAMUSCULAR | Status: DC | PRN
  Administered 2022-06-01: 10 mL via PERINEURAL

## 2022-06-01 MED ORDER — SENNA 8.6 MG PO TABS: 2.0000 | ORAL_TABLET | Freq: Two times a day (BID) | ORAL | 0 refills | Status: DC

## 2022-06-01 MED ORDER — FENTANYL CITRATE (PF) 100 MCG/2ML IJ SOLN: 25.0000 ug | INTRAMUSCULAR | Status: DC | PRN

## 2022-06-01 MED ORDER — SODIUM CHLORIDE 0.9 % IV SOLN: INTRAVENOUS | Status: DC

## 2022-06-01 MED ORDER — BUPIVACAINE HCL (PF) 0.5 % IJ SOLN
INTRAMUSCULAR | Status: DC | PRN
  Administered 2022-06-01: 10 mL via PERINEURAL

## 2022-06-01 MED ORDER — FENTANYL CITRATE (PF) 100 MCG/2ML IJ SOLN
INTRAMUSCULAR | Status: AC
  Filled 2022-06-01: qty 2

## 2022-06-01 MED ORDER — PROPOFOL 500 MG/50ML IV EMUL
INTRAVENOUS | Status: AC
  Filled 2022-06-01: qty 50

## 2022-06-01 MED ORDER — ACETAMINOPHEN 160 MG/5ML PO SOLN: 325.0000 mg | ORAL | Status: DC | PRN

## 2022-06-01 MED ORDER — OXYCODONE HCL 5 MG PO TABS: 5.0000 mg | ORAL_TABLET | Freq: Four times a day (QID) | ORAL | 0 refills | Status: AC | PRN

## 2022-06-01 MED ORDER — CEFAZOLIN SODIUM-DEXTROSE 2-4 GM/100ML-% IV SOLN
INTRAVENOUS | Status: AC
  Filled 2022-06-01: qty 100

## 2022-06-01 MED ORDER — PROPOFOL 10 MG/ML IV BOLUS
INTRAVENOUS | Status: DC | PRN
  Administered 2022-06-01: 150 mg via INTRAVENOUS

## 2022-06-01 MED ORDER — VANCOMYCIN HCL 500 MG IV SOLR
INTRAVENOUS | Status: DC | PRN
  Administered 2022-06-01: 500 mg via TOPICAL

## 2022-06-01 MED ORDER — LIDOCAINE 2% (20 MG/ML) 5 ML SYRINGE
INTRAMUSCULAR | Status: AC
  Filled 2022-06-01: qty 5

## 2022-06-01 MED ORDER — MIDAZOLAM HCL 2 MG/2ML IJ SOLN
2.0000 mg | Freq: Once | INTRAMUSCULAR | Status: AC
  Administered 2022-06-01: 2 mg via INTRAVENOUS

## 2022-06-01 MED ORDER — PROPOFOL 500 MG/50ML IV EMUL
INTRAVENOUS | Status: DC | PRN
  Administered 2022-06-01: 25 ug/kg/min via INTRAVENOUS

## 2022-06-01 MED ORDER — ACETAMINOPHEN 325 MG PO TABS: 325.0000 mg | ORAL_TABLET | ORAL | Status: DC | PRN

## 2022-06-01 MED ORDER — DEXAMETHASONE SODIUM PHOSPHATE 10 MG/ML IJ SOLN
INTRAMUSCULAR | Status: AC
  Filled 2022-06-01: qty 1

## 2022-06-01 MED ORDER — MIDAZOLAM HCL 2 MG/2ML IJ SOLN
INTRAMUSCULAR | Status: AC
  Filled 2022-06-01: qty 2

## 2022-06-01 MED ORDER — DOCUSATE SODIUM 100 MG PO CAPS: 100.0000 mg | ORAL_CAPSULE | Freq: Two times a day (BID) | ORAL | 0 refills | Status: DC

## 2022-06-01 MED ORDER — PHENYLEPHRINE HCL (PRESSORS) 10 MG/ML IV SOLN
INTRAVENOUS | Status: DC | PRN
  Administered 2022-06-01: 80 ug via INTRAVENOUS

## 2022-06-01 MED ORDER — OXYCODONE HCL 5 MG PO TABS: 5.0000 mg | ORAL_TABLET | Freq: Once | ORAL | Status: DC | PRN

## 2022-06-01 MED ORDER — LIDOCAINE HCL (CARDIAC) PF 100 MG/5ML IV SOSY
PREFILLED_SYRINGE | INTRAVENOUS | Status: DC | PRN
  Administered 2022-06-01: 40 mg via INTRAVENOUS

## 2022-06-01 MED ORDER — ONDANSETRON HCL 4 MG/2ML IJ SOLN
INTRAMUSCULAR | Status: DC | PRN
  Administered 2022-06-01: 4 mg via INTRAVENOUS

## 2022-06-01 MED ORDER — FENTANYL CITRATE (PF) 100 MCG/2ML IJ SOLN
100.0000 ug | Freq: Once | INTRAMUSCULAR | Status: AC
  Administered 2022-06-01: 100 ug via INTRAVENOUS

## 2022-06-01 MED ORDER — LACTATED RINGERS IV SOLN: INTRAVENOUS | Status: DC

## 2022-06-01 MED ORDER — SCOPOLAMINE 1 MG/3DAYS TD PT72
MEDICATED_PATCH | TRANSDERMAL | Status: DC | PRN
  Administered 2022-06-01: 1 via TRANSDERMAL

## 2022-06-01 SURGICAL SUPPLY — 92 items
APL PRP STRL LF DISP 70% ISPRP (MISCELLANEOUS) ×1
BANDAGE ESMARK 6X9 LF (GAUZE/BANDAGES/DRESSINGS) IMPLANT
BIT DRILL CANN 2.4 (BIT) ×2
BIT DRILL CANN MAX VPC 2.4 (BIT) IMPLANT
BIT DRILL PRFL CANNULTD 3.4MM (BIT) IMPLANT
BLADE AVERAGE 25X9 (BLADE) IMPLANT
BLADE LONG MED 25X9 (BLADE) IMPLANT
BLADE MICRO SAGITTAL (BLADE) IMPLANT
BLADE MINI RND TIP GREEN BEAV (BLADE) IMPLANT
BLADE OSC/SAG .038X5.5 CUT EDG (BLADE) IMPLANT
BLADE SURG 15 STRL LF DISP TIS (BLADE) ×3 IMPLANT
BLADE SURG 15 STRL SS (BLADE) ×6
BNDG CMPR 75X21 PLY HI ABS (MISCELLANEOUS)
BNDG CMPR 9X6 STRL LF SNTH (GAUZE/BANDAGES/DRESSINGS)
BNDG ELASTIC 4X5.8 VLCR STR LF (GAUZE/BANDAGES/DRESSINGS) ×2 IMPLANT
BNDG ELASTIC 6X5.8 VLCR STR LF (GAUZE/BANDAGES/DRESSINGS) IMPLANT
BNDG ESMARK 6X9 LF (GAUZE/BANDAGES/DRESSINGS)
BNDG GZE 12X3 1 PLY HI ABS (GAUZE/BANDAGES/DRESSINGS) ×1
BNDG STRETCH GAUZE 3IN X12FT (GAUZE/BANDAGES/DRESSINGS) ×2 IMPLANT
BUR MIS CONICAL WEDGE 4.3X13 (BUR) IMPLANT
BUR MIS STRT 2.0X13 (BUR) IMPLANT
BURR CONICAL 4.3 (BUR)
BURR MIS CONICAL WEDGE 4.3X13 (BUR)
BURR MIS STRT 2.0X13 (BUR) ×1
BURR SHAVER STRAIGHT 2.0X13 (BUR) ×2
CHLORAPREP W/TINT 26 (MISCELLANEOUS) ×2 IMPLANT
COVER BACK TABLE 60X90IN (DRAPES) ×2 IMPLANT
CUFF TOURN SGL QUICK 24 (TOURNIQUET CUFF)
CUFF TOURN SGL QUICK 34 (TOURNIQUET CUFF)
CUFF TRNQT CYL 24X4X16.5-23 (TOURNIQUET CUFF) IMPLANT
CUFF TRNQT CYL 34X4.125X (TOURNIQUET CUFF) IMPLANT
DRAPE EXTREMITY T 121X128X90 (DISPOSABLE) ×2 IMPLANT
DRAPE OEC MINIVIEW 54X84 (DRAPES) ×2 IMPLANT
DRAPE U-SHAPE 47X51 STRL (DRAPES) ×2 IMPLANT
DRILL PROFILE CANNULATED 3.4MM (BIT) ×2
DRSG MEPITEL 4X7.2 (GAUZE/BANDAGES/DRESSINGS) ×2 IMPLANT
DRSG PAD ABDOMINAL 8X10 ST (GAUZE/BANDAGES/DRESSINGS) ×2 IMPLANT
ELECT REM PT RETURN 9FT ADLT (ELECTROSURGICAL) ×2
ELECTRODE REM PT RTRN 9FT ADLT (ELECTROSURGICAL) ×1 IMPLANT
GAUZE SPONGE 4X4 12PLY STRL (GAUZE/BANDAGES/DRESSINGS) ×2 IMPLANT
GAUZE STRETCH 2X75IN STRL (MISCELLANEOUS) IMPLANT
GLOVE BIO SURGEON STRL SZ8 (GLOVE) ×2 IMPLANT
GLOVE BIOGEL PI IND STRL 8 (GLOVE) ×2 IMPLANT
GLOVE BIOGEL PI INDICATOR 8 (GLOVE) ×2
GLOVE ECLIPSE 8.0 STRL XLNG CF (GLOVE) ×2 IMPLANT
GOWN STRL REUS W/ TWL LRG LVL3 (GOWN DISPOSABLE) ×1 IMPLANT
GOWN STRL REUS W/ TWL XL LVL3 (GOWN DISPOSABLE) ×2 IMPLANT
GOWN STRL REUS W/TWL LRG LVL3 (GOWN DISPOSABLE) ×2
GOWN STRL REUS W/TWL XL LVL3 (GOWN DISPOSABLE) ×4
K-WIRE COCR 1.1X105 (WIRE) ×4
K-WIRE DBL .054X9 NSTRL (WIRE) ×2
KWIRE COCR 1.1X105 (WIRE) IMPLANT
KWIRE DBL .054X9 NSTRL (WIRE) ×1 IMPLANT
NDL HYPO 25X1 1.5 SAFETY (NEEDLE) IMPLANT
NEEDLE HYPO 22GX1.5 SAFETY (NEEDLE) IMPLANT
NEEDLE HYPO 25X1 1.5 SAFETY (NEEDLE) IMPLANT
NS IRRIG 1000ML POUR BTL (IV SOLUTION) ×2 IMPLANT
PACK BASIN DAY SURGERY FS (CUSTOM PROCEDURE TRAY) ×2 IMPLANT
PAD CAST 4YDX4 CTTN HI CHSV (CAST SUPPLIES) ×1 IMPLANT
PADDING CAST ABS 4INX4YD NS (CAST SUPPLIES)
PADDING CAST ABS COTTON 4X4 ST (CAST SUPPLIES) IMPLANT
PADDING CAST COTTON 4X4 STRL (CAST SUPPLIES) ×2
PADDING CAST COTTON 6X4 STRL (CAST SUPPLIES) IMPLANT
PENCIL SMOKE EVACUATOR (MISCELLANEOUS) ×2 IMPLANT
SANITIZER HAND PURELL 535ML FO (MISCELLANEOUS) ×2 IMPLANT
SCREW CANN MAX VPC 3.4X18 (Screw) IMPLANT
SCREW VCP 3.4X14 (Screw) ×1 IMPLANT
SCREW VPC 3.4X30MM (Screw) IMPLANT
SCREW VPS 3.4X18MM (Screw) ×2 IMPLANT
SET IRRIGATION 3.8 (SET/KITS/TRAYS/PACK) ×1 IMPLANT
SHEET MEDIUM DRAPE 40X70 STRL (DRAPES) ×2 IMPLANT
SLEEVE SCD COMPRESS KNEE MED (STOCKING) ×2 IMPLANT
SPLINT FAST PLASTER 5X30 (CAST SUPPLIES)
SPLINT PLASTER CAST FAST 5X30 (CAST SUPPLIES) IMPLANT
SPONGE T-LAP 18X18 ~~LOC~~+RFID (SPONGE) ×2 IMPLANT
STOCKINETTE 6  STRL (DRAPES) ×2
STOCKINETTE 6 STRL (DRAPES) ×1 IMPLANT
SUCTION FRAZIER HANDLE 10FR (MISCELLANEOUS) ×2
SUCTION TUBE FRAZIER 10FR DISP (MISCELLANEOUS) ×1 IMPLANT
SUT ETHILON 3 0 PS 1 (SUTURE) ×2 IMPLANT
SUT MNCRL AB 3-0 PS2 18 (SUTURE) ×2 IMPLANT
SUT VIC AB 2-0 SH 27 (SUTURE)
SUT VIC AB 2-0 SH 27XBRD (SUTURE) IMPLANT
SUT VICRYL 0 SH 27 (SUTURE) IMPLANT
SUT VICRYL 0 UR6 27IN ABS (SUTURE) IMPLANT
SYR BULB EAR ULCER 3OZ GRN STR (SYRINGE) ×2 IMPLANT
SYR CONTROL 10ML LL (SYRINGE) IMPLANT
TOWEL GREEN STERILE FF (TOWEL DISPOSABLE) ×4 IMPLANT
TUBE CONNECTING 20X1/4 (TUBING) IMPLANT
UNDERPAD 30X36 HEAVY ABSORB (UNDERPADS AND DIAPERS) ×2 IMPLANT
VPC SCREW 3.4X30MM (Screw) ×2 IMPLANT
YANKAUER SUCT BULB TIP NO VENT (SUCTIONS) IMPLANT

## 2022-06-01 NOTE — Anesthesia Procedure Notes (Signed)
Anesthesia Regional Block: Adductor canal block   Pre-Anesthetic Checklist: , timeout performed,  Correct Patient, Correct Site, Correct Laterality,  Correct Procedure, Correct Position, site marked,  Risks and benefits discussed,  Surgical consent,  Pre-op evaluation,  At surgeon's request and post-op pain management  Laterality: Right  Prep: chloraprep       Needles:  Injection technique: Single-shot  Needle Type: Echogenic Stimulator Needle     Needle Length: 5cm  Needle Gauge: 22     Additional Needles:   Procedures:, nerve stimulator,,, ultrasound used (permanent image in chart),,    Narrative:  Start time: 06/01/2022 6:55 AM End time: 06/01/2022 7:00 AM Injection made incrementally with aspirations every 5 mL.  Performed by: Personally  Anesthesiologist: Janeece Riggers, MD  Additional Notes: Functioning IV was confirmed and monitors were applied.  A 82m 22ga Arrow echogenic stimulator needle was used. Sterile prep and drape,hand hygiene and sterile gloves were used. Ultrasound guidance: relevant anatomy identified, needle position confirmed, local anesthetic spread visualized around nerve(s)., vascular puncture avoided.  Image printed for medical record. Negative aspiration and negative test dose prior to incremental administration of local anesthetic. The patient tolerated the procedure well.

## 2022-06-01 NOTE — Transfer of Care (Signed)
Immediate Anesthesia Transfer of Care Note  Patient: Tamara Adkins  Procedure(s) Performed: Right 1st metatarsal scarf osteotomy and proximal phalanx akin osteotomy (Right: Toe)  Patient Location: PACU  Anesthesia Type:GA combined with regional for post-op pain  Level of Consciousness: sedated  Airway & Oxygen Therapy: Patient Spontanous Breathing and Patient connected to face mask oxygen  Post-op Assessment: Report given to RN and Post -op Vital signs reviewed and stable  Post vital signs: Reviewed and stable  Last Vitals:  Vitals Value Taken Time  BP 90/62 06/01/22 0841  Temp    Pulse 77 06/01/22 0843  Resp 8 06/01/22 0843  SpO2 100 % 06/01/22 0843  Vitals shown include unvalidated device data.  Last Pain:  Vitals:   06/01/22 0630  TempSrc: Oral  PainSc: 0-No pain      Patients Stated Pain Goal: 3 (14/38/88 7579)  Complications: No notable events documented.

## 2022-06-01 NOTE — Anesthesia Procedure Notes (Signed)
Anesthesia Regional Block: Popliteal block   Pre-Anesthetic Checklist: , timeout performed,  Correct Patient, Correct Site, Correct Laterality,  Correct Procedure, Correct Position, site marked,  Risks and benefits discussed,  Surgical consent,  Pre-op evaluation,  At surgeon's request and post-op pain management  Laterality: Right  Prep: chloraprep       Needles:  Injection technique: Single-shot  Needle Type: Echogenic Stimulator Needle     Needle Length: 5cm  Needle Gauge: 22     Additional Needles:   Procedures:, nerve stimulator,,, ultrasound used (permanent image in chart),,     Nerve Stimulator or Paresthesia:  Response: foot, 0.45 mA  Additional Responses:   Narrative:  Start time: 06/01/2022 7:00 AM End time: 06/01/2022 7:10 AM Injection made incrementally with aspirations every 5 mL.  Performed by: Personally  Anesthesiologist: Janeece Riggers, MD  Additional Notes: Functioning IV was confirmed and monitors were applied.  A 82m 22ga Arrow echogenic stimulator needle was used. Sterile prep and drape,hand hygiene and sterile gloves were used. Ultrasound guidance: relevant anatomy identified, needle position confirmed, local anesthetic spread visualized around nerve(s)., vascular puncture avoided.  Image printed for medical record. Negative aspiration and negative test dose prior to incremental administration of local anesthetic. The patient tolerated the procedure well.

## 2022-06-01 NOTE — Op Note (Signed)
06/01/2022  8:46 AM  PATIENT:  Tamara Adkins  56 y.o. female  PRE-OPERATIVE DIAGNOSIS:  Right foot bunion  POST-OPERATIVE DIAGNOSIS:  Right foot bunion  Procedure(s): 1.  Correction of right foot bunion deformity with double osteotomy (scarf and Akin) 2.  Right foot AP, lateral radiographs  SURGEON:  Wylene Simmer, MD  ASSISTANT: Mechele Claude, PA-C  ANESTHESIA:   General, regional  EBL:  minimal   TOURNIQUET:   Total Tourniquet Time Documented: Thigh (Right) - 44 minutes Total: Thigh (Right) - 44 minutes  COMPLICATIONS:  None apparent  DISPOSITION:  Extubated, awake and stable to recovery.  INDICATION FOR PROCEDURE: 56 year old female without significant past medical history complains of right forefoot pain worsening over the last several years.  She has failed nonoperative treatment to date and presents today for surgical correction of this painful right foot bunion deformity.  The risks and benefits of the alternative treatment options have been discussed in detail.  The patient wishes to proceed with surgery and specifically understands risks of bleeding, infection, nerve damage, blood clots, need for additional surgery, amputation and death.   PROCEDURE IN DETAIL: After preoperative consent was obtained and the correct operative site was identified, the patient was brought the operating room and placed supine on the operating table.  General anesthesia was induced.  Preoperative antibiotics were administered.  Surgical timeout was taken.  The right lower extremity was prepped and draped in standard sterile fashion with a tourniquet around the thigh.  The extremity was elevated and the tourniquet was inflated to 250 mmHg.  A longitudinal incision was made in the dorsum of the first webspace.  Dissection was carried sharply down through the subcutaneous tissues.  The intermetatarsal ligament was divided.  An arthrotomy was then made between the lateral sesamoid and the metatarsal  head.  The hallux could then be positioned into slight varus passively.  Attention was then turned to the medial forefoot.  A longitudinal incision was made from the metatarsal head to the metatarsal shaft.  Dissection was carried sharply down through the subcutaneous tissues.  The medial joint capsule was incised and elevated dorsally and plantarly.  The hypertrophic medial eminence was resected in line with the metatarsal shaft with the oscillating saw.  A scarf osteotomy was then made in the first metatarsal.  The osteotomy was mobilized and the capital fragment translated laterally to correct the intermetatarsal and hallux valgus angles.  Appropriate correction was noted on radiographs.  The osteotomy was then fixed with two 3.4 mm [employer] VPC screws.  Overhanging bone was trimmed with the oscillating saw.  The patient was noted to have residual hallux valgus interphalangeus.  The decision was made to proceed with [facility] osteotomy.  A stab incision was made at the midportion of the proximal phalanx.  Subperiosteal dissection was carried dorsally and plantarly.  A 2 mm x 12 mm Shannon bur was driven obliquely into the bone.  The osteotomy was completed dorsally and plantarly.  Radiographs confirmed appropriate position of the osteotomy.  The osteotomy was closed.  A guidepin was inserted from the medial base of the proximal phalanx into the lateral head of the proximal phalanx.  A radiograph confirmed appropriate position of the guidepin.  A 3.4 mm VPC screw was inserted.  It was noted to close the osteotomy site down appropriately.  Final AP and lateral radiographs confirmed appropriate position and length of all hardware and appropriate correction of the bunion deformity.  The wound was then irrigated copiously  and sprinkled with vancomycin powder.  The medial joint capsule was repaired with imbricating sutures of 2-0 Vicryl.  Subcutaneous tissues were approximated with Vicryl.  Skin incisions were  closed with nylon.  Sterile dressings were applied followed by a bunion wrap.  The tourniquet was released after application of the dressings.  The patient was awakened from anesthesia and transported to the recovery room in stable condition.   FOLLOW UP PLAN: Weightbearing as tolerated in a flat postop shoe.  Follow-up in the office in 2 weeks for suture removal and a toe spacer.  No indication for DVT prophylaxis in this ambulatory patient.   RADIOGRAPHS: AP and lateral radiographs of the right foot are obtained intraoperatively.  These show interval correction of the hallux valgus and intermetatarsal angles.  Hardware is appropriately positioned and of the appropriate lengths.  No other acute injuries are noted.    Mechele Claude PA-C was present and scrubbed for the duration of the operative case. His assistance was essential in positioning the patient, prepping and draping, gaining and maintaining exposure, performing the operation, closing and dressing the wounds and applying the splint.

## 2022-06-01 NOTE — Progress Notes (Signed)
Assisted Dr. Ambrose Pancoast with right, adductor canal, popliteal block. Side rails up, monitors on throughout procedure. See vital signs in flow sheet. Tolerated Procedure well.

## 2022-06-01 NOTE — Discharge Instructions (Addendum)
John Hewitt, MD [facility]  Please read the following information regarding your care after surgery.  Medications  You only need a prescription for the narcotic pain medicine (ex. oxycodone, Percocet, Norco).  All of the other medicines listed below are available over the counter. ? Aleve 2 pills twice a day for the first 3 days after surgery. ? acetominophen (Tylenol) 650 mg every 4-6 hours as you need for minor to moderate pain ? oxycodone as prescribed for severe pain  Narcotic pain medicine (ex. oxycodone, Percocet, Vicodin) will cause constipation.  To prevent this problem, take the following medicines while you are taking any pain medicine. ? docusate sodium (Colace) 100 mg twice a day ? senna (Senokot) 2 tablets twice a day  Weight Bearing ? Bear weight only on your operated foot in the post-op shoe.   Cast / Splint / Dressing ? Keep your splint, cast or dressing clean and dry.  Don't put anything (coat hanger, pencil, etc) down inside of it.  If it gets damp, use a hair dryer on the cool setting to dry it.  If it gets soaked, call the office to schedule an appointment for a cast change.   After your dressing, cast or splint is removed; you may shower, but do not soak or scrub the wound.  Allow the water to run over it, and then gently pat it dry.  Swelling It is normal for you to have swelling where you had surgery.  To reduce swelling and pain, keep your toes above your nose for at least 3 days after surgery.  It may be necessary to keep your foot or leg elevated for several weeks.  If it hurts, it should be elevated.  Follow Up Call my office at [phone] when you are discharged from the hospital or surgery center to schedule an appointment to be seen two weeks after surgery.  Call my office at [phone] if you develop a fever >101.5 F, nausea, vomiting, bleeding from the surgical site or severe pain.      Post Anesthesia Home Care Instructions  Activity: Get  plenty of rest for the remainder of the day. A responsible individual must stay with you for 24 hours following the procedure.  For the next 24 hours, DO NOT: -Drive a car -Operate machinery -Drink alcoholic beverages -Take any medication unless instructed by your physician -Make any legal decisions or sign important papers.  Meals: Start with liquid foods such as gelatin or soup. Progress to regular foods as tolerated. Avoid greasy, spicy, heavy foods. If nausea and/or vomiting occur, drink only clear liquids until the nausea and/or vomiting subsides. Call your physician if vomiting continues.  Special Instructions/Symptoms: Your throat may feel dry or sore from the anesthesia or the breathing tube placed in your throat during surgery. If this causes discomfort, gargle with warm salt water. The discomfort should disappear within 24 hours.  If you had a scopolamine patch placed behind your ear for the management of post- operative nausea and/or vomiting:  1. The medication in the patch is effective for 72 hours, after which it should be removed.  Wrap patch in a tissue and discard in the trash. Wash hands thoroughly with soap and water. 2. You may remove the patch earlier than 72 hours if you experience unpleasant side effects which may include dry mouth, dizziness or visual disturbances. 3. Avoid touching the patch. Wash your hands with soap and water after contact with the patch.    Regional Anesthesia Blocks    1. Numbness or the inability to move the "blocked" extremity may last from 3-48 hours after placement. The length of time depends on the medication injected and your individual response to the medication. If the numbness is not going away after 48 hours, call your surgeon.  2. The extremity that is blocked will need to be protected until the numbness is gone and the  Strength has returned. Because you cannot feel it, you will need to take extra care to avoid injury. Because it may be  weak, you may have difficulty moving it or using it. You may not know what position it is in without looking at it while the block is in effect.  3. For blocks in the legs and feet, returning to weight bearing and walking needs to be done carefully. You will need to wait until the numbness is entirely gone and the strength has returned. You should be able to move your leg and foot normally before you try and bear weight or walk. You will need someone to be with you when you first try to ensure you do not fall and possibly risk injury.  4. Bruising and tenderness at the needle site are common side effects and will resolve in a few days.  5. Persistent numbness or new problems with movement should be communicated to the surgeon or the [facility] ([phone])/ [facility] ([phone]). 

## 2022-06-01 NOTE — Anesthesia Procedure Notes (Signed)
Procedure Name: LMA Insertion Date/Time: 06/01/2022 7:36 AM  Performed by: Maryella Shivers, CRNAPre-anesthesia Checklist: Patient identified, Emergency Drugs available, Suction available and Patient being monitored Patient Re-evaluated:Patient Re-evaluated prior to induction Oxygen Delivery Method: Circle system utilized Preoxygenation: Pre-oxygenation with 100% oxygen Induction Type: IV induction Ventilation: Mask ventilation without difficulty LMA: LMA inserted LMA Size: 4.0 Number of attempts: 1 Airway Equipment and Method: Bite block Placement Confirmation: positive ETCO2 Tube secured with: Tape Dental Injury: Teeth and Oropharynx as per pre-operative assessment

## 2022-06-01 NOTE — Anesthesia Postprocedure Evaluation (Signed)
Anesthesia Post Note  Patient: Tamara Adkins  Procedure(s) Performed: Right 1st metatarsal scarf osteotomy and proximal phalanx akin osteotomy (Right: Toe)     Patient location during evaluation: PACU Anesthesia Type: General Level of consciousness: awake and alert Pain management: pain level controlled Vital Signs Assessment: post-procedure vital signs reviewed and stable Respiratory status: spontaneous breathing, nonlabored ventilation, respiratory function stable and patient connected to nasal cannula oxygen Cardiovascular status: blood pressure returned to baseline and stable Postop Assessment: no apparent nausea or vomiting Anesthetic complications: no   No notable events documented.  Last Vitals:  Vitals:   06/01/22 0900 06/01/22 0918  BP: 113/76 115/75  Pulse: 97 87  Resp: 14 15  Temp:    SpO2: 100% 98%    Last Pain:  Vitals:   06/01/22 0900  TempSrc:   PainSc: 0-No pain                 Marilene Vath

## 2022-06-01 NOTE — H&P (Signed)
Tamara Adkins is an 56 y.o. female.   Chief Complaint: Right foot pain HPI: 56 year old female without significant past medical history complains of right foot pain due to a prominent bunion deformity.  She has failed nonoperative treatment to date including activity modification, oral anti-inflammatories and shoewear modification.  She presents today for surgical correction of this painful forefoot condition.  Past Medical History:  Diagnosis Date   Fibroids    Hypothyroidism    PONV (postoperative nausea and vomiting)    Thyroid disease     Past Surgical History:  Procedure Laterality Date   AUGMENTATION MAMMAPLASTY Bilateral     Family History  Problem Relation Age of Onset   Hyperlipidemia Mother    Hypertension Mother    Hyperlipidemia Father    Hypertension Father    Stroke Father    Other Brother 12       Car Accident   Heart attack Maternal Grandmother    Thyroid disease Maternal Grandmother        hypothroid   Heart attack Paternal Grandmother    Breast cancer Neg Hx    Social History:  reports that she has never smoked. She has never used smokeless tobacco. She reports that she does not currently use alcohol. She reports that she does not currently use drugs.  Allergies:  Allergies  Allergen Reactions   Hydrocodone Itching   Wound Dressing Adhesive Rash    Bandaids, tape    Medications Prior to Admission  Medication Sig Dispense Refill   levothyroxine (SYNTHROID) 25 MCG tablet TAKE 1 &1/2 TABLETS DAILY BEFORE BREAKFAST (Patient taking differently: 37.5 mcg. TAKE 1 &1/2 TABLETS DAILY BEFORE BREAKFAST) 135 tablet 1    No results found for this or any previous visit (from the past 48 hour(s)). DG MINI C-ARM IMAGE ONLY  Result Date: 2022/06/08 There is no interpretation for this exam.  This order is for images obtained during a surgical procedure.  Please See "Surgeries" Tab for more information regarding the procedure.    Review of Systems no recent fever,  chills, nausea, vomiting or changes in her appetite  Blood pressure 123/85, pulse 83, temperature 98.6 F (37 C), temperature source Oral, resp. rate 16, height '5\' 2"'$  (1.575 m), weight 53.1 kg, last menstrual period 04/18/2018, SpO2 99 %. Physical Exam  Well-nourished well-developed woman in no apparent distress.  Alert and oriented x4.  Normal mood and affect.  Gait is normal.  The right foot has a moderate bunion deformity.  Right foot has healthy skin and palpable pulses.  Normal sensibility to light touch dorsally and plantarly at the forefoot.  No lymphadenopathy.   Assessment/Plan Painful right foot bunion deformity -to the operating room today for right foot bunion correction with double osteotomy (scarf, Aiken).  The risks and benefits of the alternative treatment options have been discussed in detail.  The patient wishes to proceed with surgery and specifically understands risks of bleeding, infection, nerve damage, blood clots, need for additional surgery, amputation and death.   Wylene Simmer, MD 2022-06-08, 7:20 AM

## 2022-06-02 ENCOUNTER — Encounter (HOSPITAL_BASED_OUTPATIENT_CLINIC_OR_DEPARTMENT_OTHER): Payer: Self-pay | Admitting: Orthopedic Surgery

## 2022-06-27 DIAGNOSIS — Z1211 Encounter for screening for malignant neoplasm of colon: Secondary | ICD-10-CM | POA: Diagnosis not present

## 2022-06-27 DIAGNOSIS — K5904 Chronic idiopathic constipation: Secondary | ICD-10-CM | POA: Diagnosis not present

## 2022-06-27 DIAGNOSIS — K600 Acute anal fissure: Secondary | ICD-10-CM | POA: Diagnosis not present

## 2022-06-27 DIAGNOSIS — K625 Hemorrhage of anus and rectum: Secondary | ICD-10-CM | POA: Diagnosis not present

## 2022-07-10 ENCOUNTER — Other Ambulatory Visit: Payer: Self-pay | Admitting: Endocrinology

## 2022-07-12 ENCOUNTER — Other Ambulatory Visit (INDEPENDENT_AMBULATORY_CARE_PROVIDER_SITE_OTHER): Payer: BC Managed Care – PPO

## 2022-07-12 ENCOUNTER — Other Ambulatory Visit: Payer: Self-pay | Admitting: Endocrinology

## 2022-07-12 DIAGNOSIS — E038 Other specified hypothyroidism: Secondary | ICD-10-CM

## 2022-07-12 DIAGNOSIS — M21612 Bunion of left foot: Secondary | ICD-10-CM | POA: Diagnosis not present

## 2022-07-12 DIAGNOSIS — M2012 Hallux valgus (acquired), left foot: Secondary | ICD-10-CM | POA: Diagnosis not present

## 2022-07-12 LAB — T4, FREE: Free T4: 1.02 ng/dL (ref 0.60–1.60)

## 2022-07-14 ENCOUNTER — Other Ambulatory Visit (HOSPITAL_COMMUNITY): Payer: Self-pay | Admitting: Orthopedic Surgery

## 2022-07-17 ENCOUNTER — Encounter: Payer: Self-pay | Admitting: Endocrinology

## 2022-07-17 ENCOUNTER — Ambulatory Visit (INDEPENDENT_AMBULATORY_CARE_PROVIDER_SITE_OTHER): Payer: BC Managed Care – PPO | Admitting: Endocrinology

## 2022-07-17 VITALS — BP 112/78 | HR 78 | Ht 62.0 in | Wt 118.0 lb

## 2022-07-17 DIAGNOSIS — E038 Other specified hypothyroidism: Secondary | ICD-10-CM

## 2022-07-17 NOTE — Progress Notes (Signed)
Patient ID: Tamara Adkins, female   DOB: 1966-06-12, 56 y.o.   MRN: [id number]                                                                                                                                    Reason for Appointment: Follow-up of thyroid                                                                                               Chief complaint: Follow-up   History of Present Illness:   Prior history: Reportedly in 2004 the patient was diagnosed to have hyperthyroidism At that time she was having problems with frequent hives that continued for several months Also at times she would be feeling some palpitations She does not think she remembers any symptoms of unusual weight loss, sweating or heat intolerance  After her diagnosis of hyperthyroidism she was treated with PTU, initially higher doses and then subsequently this was reduced to apparently 50 mg twice daily by her endocrinologist No records are available from previous management She was told to continue the medication and was subjectively feeling quite well Her last evaluation with the endocrinologist was in September 2018 and no changes were made  In April 2019 patient started having joint pains in multiple joints Also she thinks she may have been starting to feel more tired Did not feel that she had any significant weight change, heat or cold intolerance at that time Also over the last 6 months has had a tendency to hair loss  She was evaluated by her PCP in June and her TSH was high she was told by her PCP to stop her PTU, TSH 11.6 and 2 days later was 6.5 On 06/19/2018 TSH was 5.17 with normal free T4  RECENT history:  Because of her complaints of fatigue and arthralgia along with a low free T4 level in 10/2018 she was started on levothyroxine 25 mcg daily Although she was recommended 50 mcg subsequently she felt some palpitations with this  dosage With levothyroxine she has had improvement in her fatigue  She is taking 37.5 mcg levothyroxine since about 11/2018  The dose has been continued unchanged since then                  She does not complain of any joint pains or fatigue recently, the symptoms had improved with thyroid supplementation  Occasional hot flashes, no cold intolerance No palpitations or shakiness  She is taking her levothyroxine regularly every morning before breakfast Has not missed any doses   Her free T4 is in a good range at about 1.0, slightly higher than the 0.87 last year  No recent weight change  Thyroid results:  Lab Results  Component Value Date   FREET4 1.02 07/12/2022   FREET4 0.87 07/04/2021   FREET4 0.93 06/28/2020   T3FREE 3.8 02/24/2019   T3FREE 3.2 11/25/2018   TSH 2.54 07/04/2021   TSH 2.04 06/28/2020   TSH 2.29 12/31/2019   TSH in 7/23 was 2.1    No results found for: "THYROTRECAB"   Allergies as of 07/17/2022       Reactions   Hydrocodone Itching   Wound Dressing Adhesive Rash   Bandaids, tape        Medication List        Accurate as of July 17, 2022  1:22 PM. If you have any questions, ask your nurse or doctor.          docusate sodium 100 MG capsule Commonly known as: Colace Take 1 capsule (100 mg total) by mouth 2 (two) times daily. While taking narcotic pain medicine.   levothyroxine 25 MCG tablet Commonly known as: SYNTHROID TAKE 1 &1/2 TABLETS DAILY BEFORE BREAKFAST What changed: See the new instructions.   senna 8.6 MG Tabs tablet Commonly known as: SENOKOT Take 2 tablets (17.2 mg total) by mouth 2 (two) times daily.            Past Medical History:  Diagnosis Date   Fibroids    Hypothyroidism    PONV (postoperative nausea and vomiting)    Thyroid disease     Past Surgical History:  Procedure Laterality Date   AUGMENTATION MAMMAPLASTY Bilateral    BUNIONECTOMY Right 06/01/2022    Procedure: Right 1st metatarsal scarf osteotomy and proximal phalanx akin osteotomy;  Surgeon: Wylene Simmer, MD;  Location: [facility];  Service: Orthopedics;  Laterality: Right;    Family History  Problem Relation Age of Onset   Hyperlipidemia Mother    Hypertension Mother    Hyperlipidemia Father    Hypertension Father    Stroke Father    Other Brother 12       Car Accident   Heart attack Maternal Grandmother    Thyroid disease Maternal Grandmother        hypothroid   Heart attack Paternal Grandmother    Breast cancer Neg Hx     Social History:  reports that she has never smoked. She has never used smokeless tobacco. She reports that she does not currently use alcohol. She reports that she does not currently use drugs.  Allergies:  Allergies  Allergen Reactions   Hydrocodone Itching   Wound Dressing Adhesive Rash    Bandaids, tape     Review of Systems  Has periodic hot flashes and night sweats, now only mild and occasional  She was recommended a calcium and vitamin D supplement but has not taken any again She says her gynecologist may be going on density this year  She has mild hypercholesterolemia    Examination:   BP 112/78   Pulse 78   Ht '5\' 2"'$  (1.575 m)   Wt 118 lb (53.5 kg)   LMP 04/18/2018 (Exact Date)   SpO2 93%   BMI 21.58 kg/m   Thyroid not palpable Biceps reflexes show normal relaxation Skin appears normal No peripheral edema  Assessment/Plan:  Mild secondary hypothyroidism:  She has required only a small dose of levothyroxine using 37.5 mcg since 10/2018 With this she has been feeling good without any particular joint pains Also no palpitations  Free T4 is quite normal as before  Since she is subjectively doing well and her free T4 she can continue the usual dose of 37.5 mcg every morning  Reminded she should take vitamin D with calcium regularly as a supplement preferably at lunch or dinner  Follow-up in 1  year  Elayne Snare 07/17/2022, 1:22 PM    Note: This office note was prepared with Dragon voice recognition system technology. Any transcriptional errors that result from this process are unintentional.     Elayne Snare

## 2022-08-09 DIAGNOSIS — M21611 Bunion of right foot: Secondary | ICD-10-CM | POA: Diagnosis not present

## 2022-08-28 DIAGNOSIS — K5730 Diverticulosis of large intestine without perforation or abscess without bleeding: Secondary | ICD-10-CM | POA: Diagnosis not present

## 2022-08-28 DIAGNOSIS — Z1211 Encounter for screening for malignant neoplasm of colon: Secondary | ICD-10-CM | POA: Diagnosis not present

## 2022-08-28 DIAGNOSIS — K5904 Chronic idiopathic constipation: Secondary | ICD-10-CM | POA: Diagnosis not present

## 2022-08-28 DIAGNOSIS — K625 Hemorrhage of anus and rectum: Secondary | ICD-10-CM | POA: Diagnosis not present

## 2022-09-05 IMAGING — MG DIGITAL SCREENING BREAST BILAT IMPLANT W/ TOMO W/ CAD
9 of 12 series · 9 of 28 positions shown · non-contrast
Comparison: Previous exam(s).

CLINICAL DATA: Screening.

EXAM:
DIGITAL SCREENING BILATERAL MAMMOGRAM WITH IMPLANTS, CAD AND
TOMOSYNTHESIS
TECHNIQUE: Bilateral screening digital craniocaudal and mediolateral oblique
mammograms were obtained. Bilateral screening digital breast
tomosynthesis was performed. The images were evaluated with
computer-aided detection. Standard and/or implant displaced views
were performed.

[R CC]
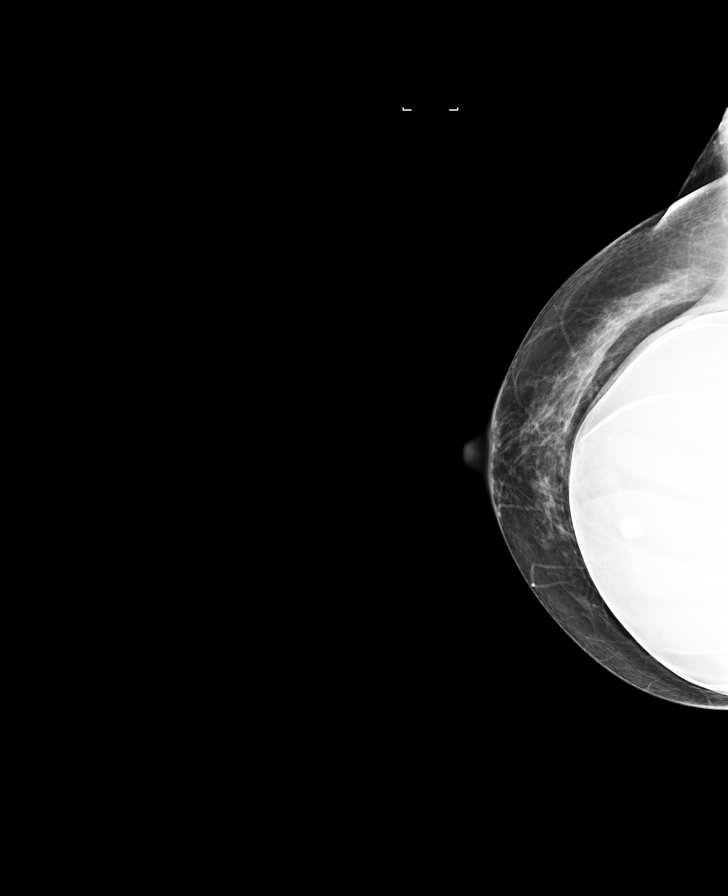

[L CC]
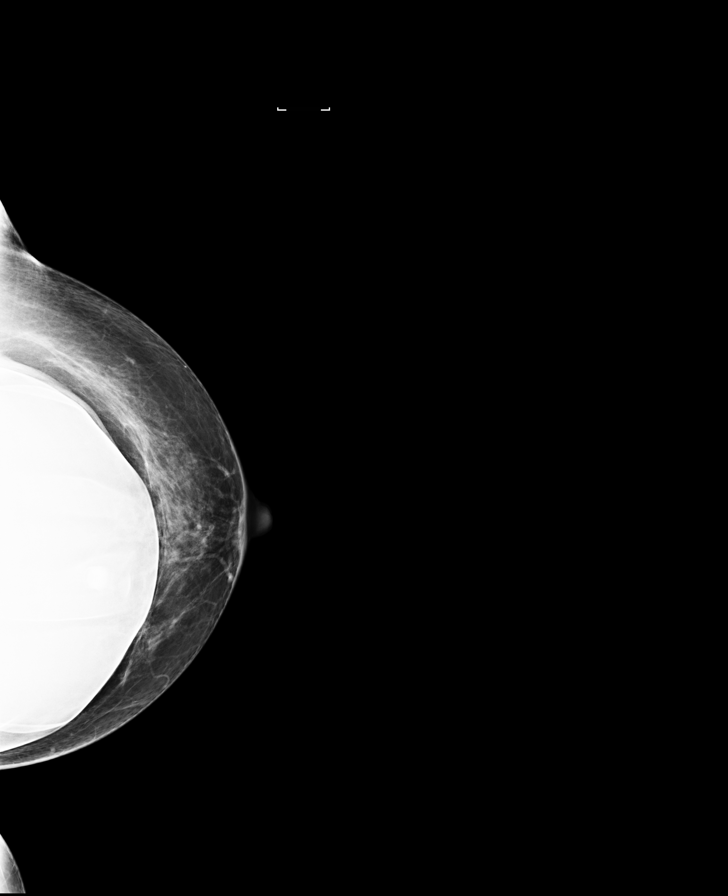

[R MLO]
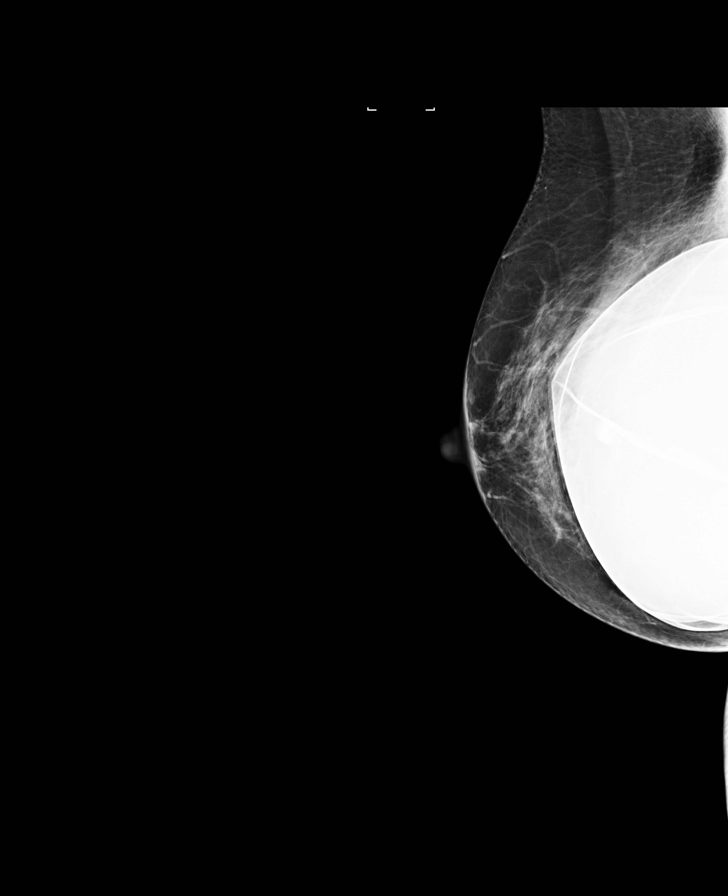

[L MLO]
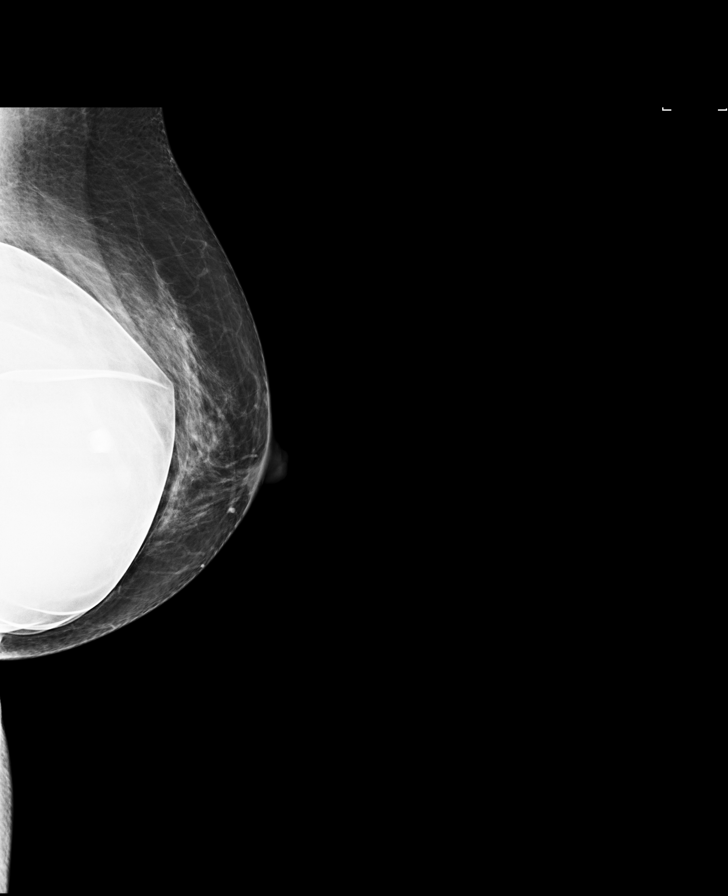

[L CC synth-2D]
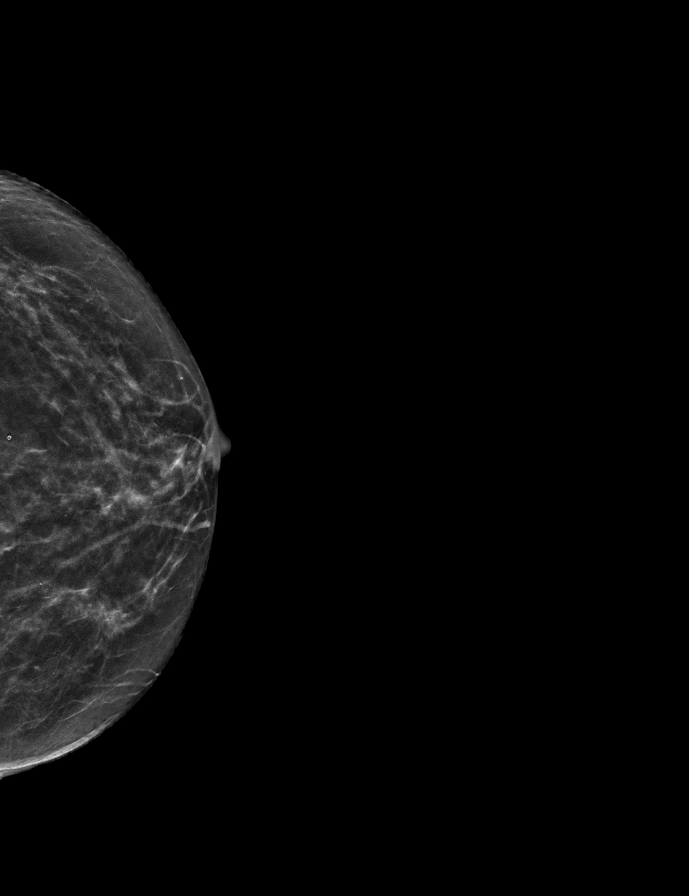

[R CC synth-2D]
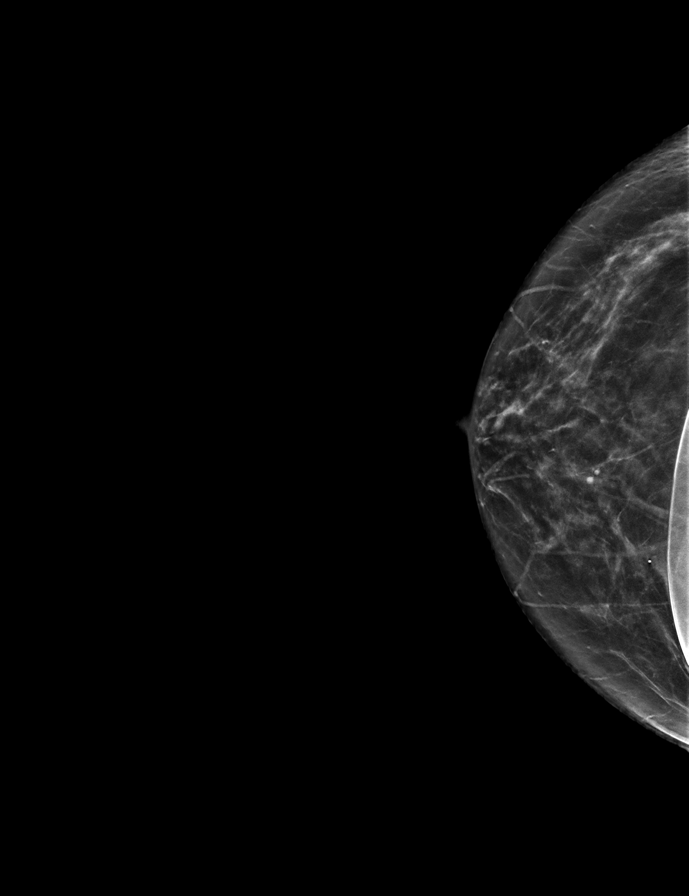

[L MLO synth-2D]
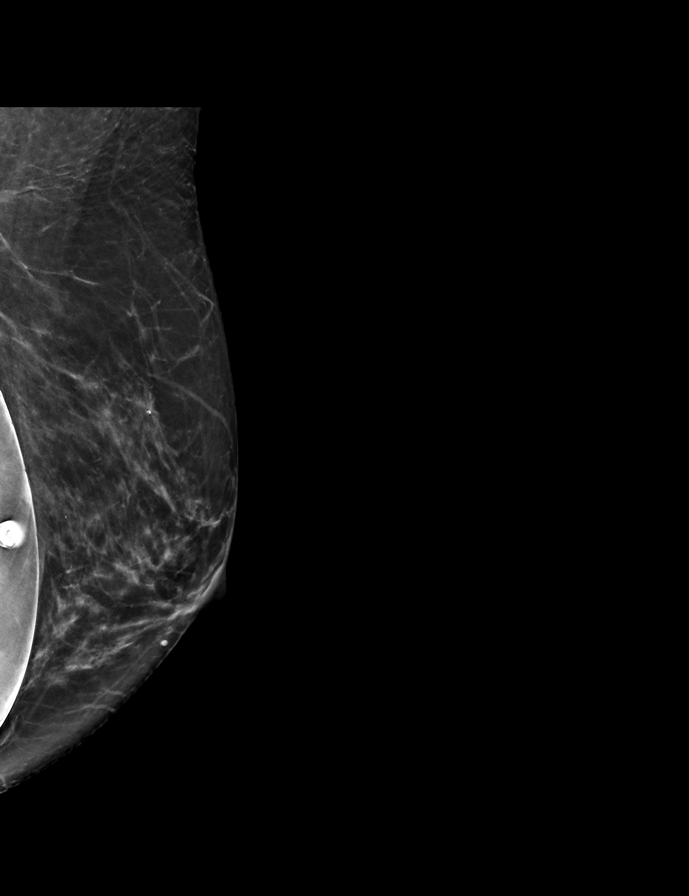

[R MLO synth-2D]
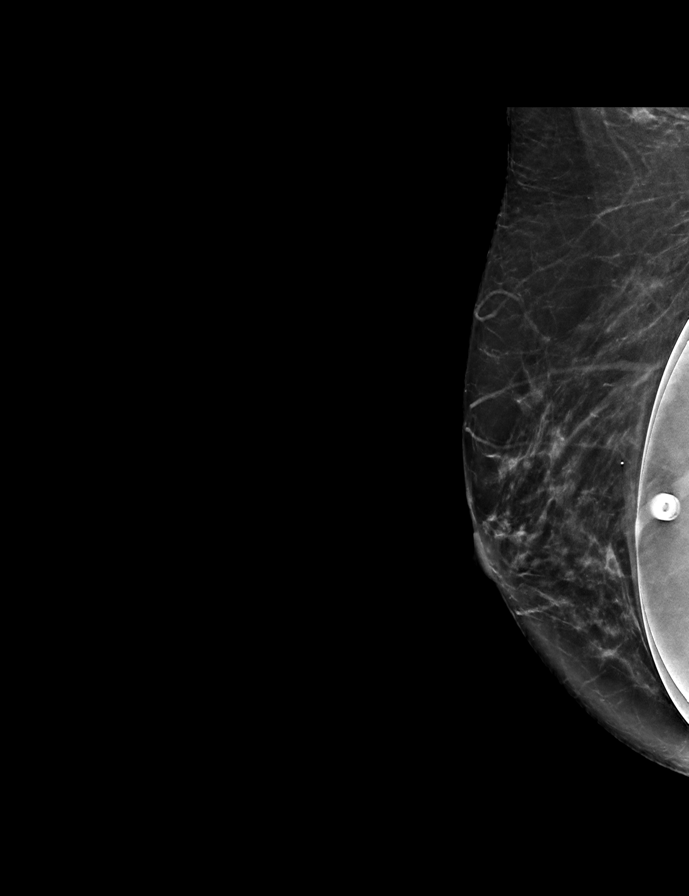

[L CCID BREAST TOMOSYNTHESIS IMAGE tomo · tomo slice 24/47.0]
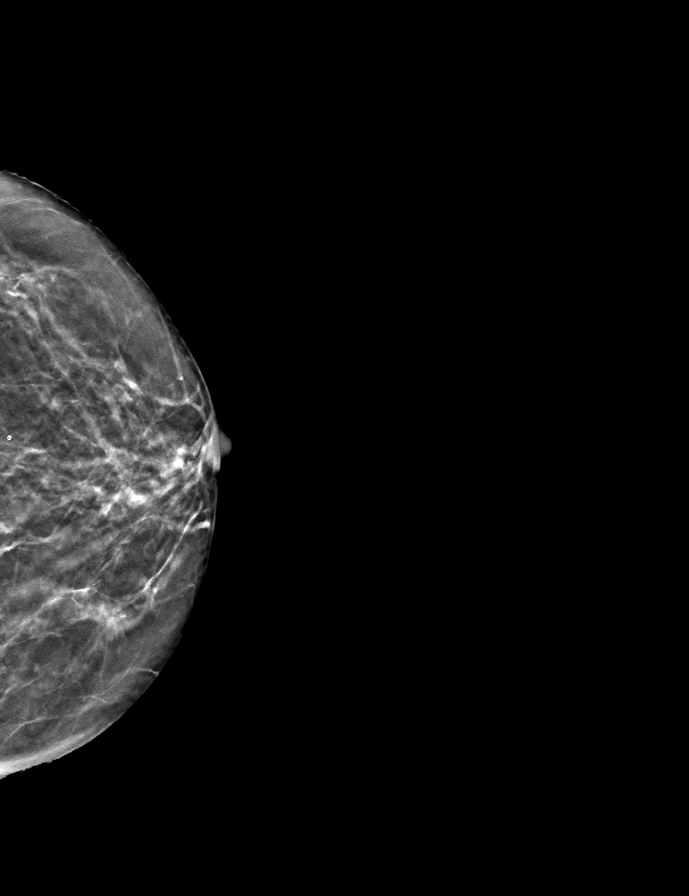

[9 of 28 positions shown; findings below may reference images not displayed]

ACR Breast Density Category b: There are scattered areas of
fibroglandular density.
FINDINGS: The patient has retropectoral implants. There are no findings
suspicious for malignancy.
IMPRESSION: No mammographic evidence of malignancy. A result letter of this
screening mammogram will be mailed directly to the patient.

RECOMMENDATION:
Screening mammogram in one year. (Code:SE-S-JMG)

BI-RADS CATEGORY  1:  Negative.

## 2022-09-13 DIAGNOSIS — Z01419 Encounter for gynecological examination (general) (routine) without abnormal findings: Secondary | ICD-10-CM | POA: Diagnosis not present

## 2022-09-13 DIAGNOSIS — Z6821 Body mass index (BMI) 21.0-21.9, adult: Secondary | ICD-10-CM | POA: Diagnosis not present

## 2022-09-20 ENCOUNTER — Other Ambulatory Visit: Payer: Self-pay

## 2022-09-20 ENCOUNTER — Encounter (HOSPITAL_BASED_OUTPATIENT_CLINIC_OR_DEPARTMENT_OTHER): Payer: Self-pay | Admitting: Orthopedic Surgery

## 2022-09-20 NOTE — Progress Notes (Signed)

## 2022-09-27 NOTE — Anesthesia Preprocedure Evaluation (Signed)
Anesthesia Evaluation  Patient identified by MRN, date of birth, ID band Patient awake    Reviewed: Allergy & Precautions, H&P , NPO status , Patient's Chart, lab work & pertinent test results  History of Anesthesia Complications (+) PONV and history of anesthetic complications  Airway Mallampati: II  TM Distance: >3 FB Neck ROM: Full    Dental no notable dental hx. (+) Dental Advisory Given   Pulmonary neg pulmonary ROS,    Pulmonary exam normal        Cardiovascular Exercise Tolerance: Good negative cardio ROS Normal cardiovascular exam     Neuro/Psych negative neurological ROS  negative psych ROS   GI/Hepatic negative GI ROS, Neg liver ROS,   Endo/Other  Hypothyroidism   Renal/GU negative Renal ROS  negative genitourinary   Musculoskeletal negative musculoskeletal ROS (+) Arthritis , Osteoarthritis,    Abdominal   Peds negative pediatric ROS (+)  Hematology negative hematology ROS (+)   Anesthesia Other Findings   Reproductive/Obstetrics negative OB ROS                            Anesthesia Physical  Anesthesia Plan  ASA: 2  Anesthesia Plan: General   Post-op Pain Management: Regional block* and Tylenol PO (pre-op)*   Induction: Intravenous  PONV Risk Score and Plan: 3 and Ondansetron, Dexamethasone, Midazolam, Propofol infusion, Scopolamine patch - Pre-op and TIVA  Airway Management Planned: LMA  Additional Equipment: None  Intra-op Plan:   Post-operative Plan: Extubation in OR  Informed Consent: I have reviewed the patients History and Physical, chart, labs and discussed the procedure including the risks, benefits and alternatives for the proposed anesthesia with the patient or authorized representative who has indicated his/her understanding and acceptance.     Dental advisory given  Plan Discussed with: Anesthesiologist and CRNA  Anesthesia Plan Comments:  (  )       Anesthesia Quick Evaluation

## 2022-09-28 ENCOUNTER — Other Ambulatory Visit: Payer: Self-pay

## 2022-09-28 ENCOUNTER — Ambulatory Visit (HOSPITAL_BASED_OUTPATIENT_CLINIC_OR_DEPARTMENT_OTHER): Payer: BC Managed Care – PPO | Admitting: Anesthesiology

## 2022-09-28 ENCOUNTER — Ambulatory Visit (HOSPITAL_BASED_OUTPATIENT_CLINIC_OR_DEPARTMENT_OTHER)
Admission: RE | Admit: 2022-09-28 | Discharge: 2022-09-28 | Disposition: A | Discharge status: Home / Self Care | Payer: BC Managed Care – PPO | Attending: Orthopedic Surgery | Admitting: Orthopedic Surgery

## 2022-09-28 ENCOUNTER — Ambulatory Visit (HOSPITAL_BASED_OUTPATIENT_CLINIC_OR_DEPARTMENT_OTHER): Payer: BC Managed Care – PPO

## 2022-09-28 ENCOUNTER — Encounter (HOSPITAL_BASED_OUTPATIENT_CLINIC_OR_DEPARTMENT_OTHER): Payer: Self-pay | Admitting: Orthopedic Surgery

## 2022-09-28 ENCOUNTER — Encounter (HOSPITAL_BASED_OUTPATIENT_CLINIC_OR_DEPARTMENT_OTHER)
Admission: RE | Disposition: A | Discharge status: Home / Self Care | Payer: Self-pay | Source: Home / Self Care | Attending: Orthopedic Surgery

## 2022-09-28 DIAGNOSIS — M2012 Hallux valgus (acquired), left foot: Secondary | ICD-10-CM | POA: Diagnosis not present

## 2022-09-28 DIAGNOSIS — M21612 Bunion of left foot: Secondary | ICD-10-CM | POA: Insufficient documentation

## 2022-09-28 DIAGNOSIS — E039 Hypothyroidism, unspecified: Secondary | ICD-10-CM | POA: Insufficient documentation

## 2022-09-28 DIAGNOSIS — G8918 Other acute postprocedural pain: Secondary | ICD-10-CM | POA: Diagnosis not present

## 2022-09-28 DIAGNOSIS — M1990 Unspecified osteoarthritis, unspecified site: Secondary | ICD-10-CM | POA: Diagnosis not present

## 2022-09-28 DIAGNOSIS — M21619 Bunion of unspecified foot: Secondary | ICD-10-CM

## 2022-09-28 HISTORY — PX: METATARSAL OSTEOTOMY WITH BUNIONECTOMY: SHX5662

## 2022-09-28 SURGERY — BUNIONECTOMY, WITH METATARSAL OSTEOTOMY
Anesthesia: General | Site: Foot | Laterality: Left

## 2022-09-28 MED ORDER — FENTANYL CITRATE (PF) 100 MCG/2ML IJ SOLN: 100.0000 ug | Freq: Once | INTRAMUSCULAR | Status: DC

## 2022-09-28 MED ORDER — PROMETHAZINE HCL 25 MG/ML IJ SOLN: 6.2500 mg | INTRAMUSCULAR | Status: DC | PRN

## 2022-09-28 MED ORDER — SODIUM CHLORIDE 0.9 % IV SOLN: INTRAVENOUS | Status: DC

## 2022-09-28 MED ORDER — BUPIVACAINE HCL (PF) 0.5 % IJ SOLN
INTRAMUSCULAR | Status: DC | PRN
  Administered 2022-09-28 (×2): 15 mL via PERINEURAL

## 2022-09-28 MED ORDER — CELECOXIB 200 MG PO CAPS
200.0000 mg | ORAL_CAPSULE | Freq: Once | ORAL | Status: AC
  Administered 2022-09-28: 200 mg via ORAL

## 2022-09-28 MED ORDER — VANCOMYCIN HCL 500 MG IV SOLR
INTRAVENOUS | Status: AC
  Filled 2022-09-28: qty 10

## 2022-09-28 MED ORDER — AMISULPRIDE (ANTIEMETIC) 5 MG/2ML IV SOLN: 10.0000 mg | Freq: Once | INTRAVENOUS | Status: DC | PRN

## 2022-09-28 MED ORDER — CEFAZOLIN SODIUM-DEXTROSE 2-4 GM/100ML-% IV SOLN
INTRAVENOUS | Status: AC
  Filled 2022-09-28: qty 100

## 2022-09-28 MED ORDER — ACETAMINOPHEN 500 MG PO TABS
ORAL_TABLET | ORAL | Status: AC
  Filled 2022-09-28: qty 2

## 2022-09-28 MED ORDER — MIDAZOLAM HCL 2 MG/2ML IJ SOLN
1.0000 mg | Freq: Once | INTRAMUSCULAR | Status: AC
  Administered 2022-09-28: 1 mg via INTRAVENOUS

## 2022-09-28 MED ORDER — ONDANSETRON HCL 4 MG/2ML IJ SOLN
INTRAMUSCULAR | Status: DC | PRN
  Administered 2022-09-28 (×2): 4 mg via INTRAVENOUS

## 2022-09-28 MED ORDER — CEFAZOLIN SODIUM-DEXTROSE 2-4 GM/100ML-% IV SOLN
2.0000 g | INTRAVENOUS | Status: AC
  Administered 2022-09-28: 2 g via INTRAVENOUS

## 2022-09-28 MED ORDER — CELECOXIB 200 MG PO CAPS
ORAL_CAPSULE | ORAL | Status: AC
  Filled 2022-09-28: qty 1

## 2022-09-28 MED ORDER — BUPIVACAINE LIPOSOME 1.3 % IJ SUSP
INTRAMUSCULAR | Status: DC | PRN
  Administered 2022-09-28: 10 mL via PERINEURAL

## 2022-09-28 MED ORDER — OXYCODONE HCL 5 MG PO TABS: 5.0000 mg | ORAL_TABLET | Freq: Four times a day (QID) | ORAL | 0 refills | Status: AC | PRN

## 2022-09-28 MED ORDER — FENTANYL CITRATE (PF) 100 MCG/2ML IJ SOLN: 25.0000 ug | INTRAMUSCULAR | Status: DC | PRN

## 2022-09-28 MED ORDER — SENNA 8.6 MG PO TABS: 2.0000 | ORAL_TABLET | Freq: Two times a day (BID) | ORAL | 0 refills | Status: DC

## 2022-09-28 MED ORDER — ACETAMINOPHEN 500 MG PO TABS
1000.0000 mg | ORAL_TABLET | Freq: Once | ORAL | Status: AC
  Administered 2022-09-28: 1000 mg via ORAL

## 2022-09-28 MED ORDER — 0.9 % SODIUM CHLORIDE (POUR BTL) OPTIME
TOPICAL | Status: DC | PRN
  Administered 2022-09-28: 120 mL

## 2022-09-28 MED ORDER — DEXAMETHASONE SODIUM PHOSPHATE 10 MG/ML IJ SOLN
INTRAMUSCULAR | Status: DC | PRN
  Administered 2022-09-28: 5 mg via INTRAVENOUS

## 2022-09-28 MED ORDER — FENTANYL CITRATE (PF) 100 MCG/2ML IJ SOLN
100.0000 ug | Freq: Once | INTRAMUSCULAR | Status: AC
  Administered 2022-09-28: 100 ug via INTRAVENOUS

## 2022-09-28 MED ORDER — PROPOFOL 10 MG/ML IV BOLUS
INTRAVENOUS | Status: DC | PRN
  Administered 2022-09-28: 200 mg via INTRAVENOUS

## 2022-09-28 MED ORDER — FENTANYL CITRATE (PF) 100 MCG/2ML IJ SOLN
INTRAMUSCULAR | Status: AC
  Filled 2022-09-28: qty 2

## 2022-09-28 MED ORDER — MIDAZOLAM HCL 2 MG/2ML IJ SOLN: 2.0000 mg | Freq: Once | INTRAMUSCULAR | Status: DC

## 2022-09-28 MED ORDER — PROPOFOL 500 MG/50ML IV EMUL
INTRAVENOUS | Status: DC | PRN
  Administered 2022-09-28: 175 ug/kg/min via INTRAVENOUS

## 2022-09-28 MED ORDER — VANCOMYCIN HCL 500 MG IV SOLR
INTRAVENOUS | Status: DC | PRN
  Administered 2022-09-28: 500 mg via TOPICAL

## 2022-09-28 MED ORDER — LACTATED RINGERS IV SOLN: INTRAVENOUS | Status: DC

## 2022-09-28 MED ORDER — MIDAZOLAM HCL 2 MG/2ML IJ SOLN
INTRAMUSCULAR | Status: AC
  Filled 2022-09-28: qty 2

## 2022-09-28 SURGICAL SUPPLY — 94 items
APL PRP STRL LF DISP 70% ISPRP (MISCELLANEOUS) ×1
BANDAGE ESMARK 6X9 LF (GAUZE/BANDAGES/DRESSINGS) IMPLANT
BIT DRILL 1.8 CANN MAX VPC (BIT) IMPLANT
BIT DRILL CANN 2.4 (BIT) ×1
BIT DRILL CANN MAX VPC 2.4 (BIT) IMPLANT
BIT DRILL PRFL CANNULTD 2.5MM (BIT) IMPLANT
BIT DRILL PRFL CANNULTD 3.4MM (BIT) IMPLANT
BLADE AVERAGE 25X9 (BLADE) IMPLANT
BLADE LONG MED 25X9 (BLADE) IMPLANT
BLADE MICRO SAGITTAL (BLADE) IMPLANT
BLADE MINI RND TIP GREEN BEAV (BLADE) IMPLANT
BLADE OSC/SAG .038X5.5 CUT EDG (BLADE) IMPLANT
BLADE SURG 15 STRL LF DISP TIS (BLADE) ×3 IMPLANT
BLADE SURG 15 STRL SS (BLADE) ×3
BNDG CMPR 75X21 PLY HI ABS (MISCELLANEOUS)
BNDG CMPR 9X6 STRL LF SNTH (GAUZE/BANDAGES/DRESSINGS)
BNDG ELASTIC 4X5.8 VLCR STR LF (GAUZE/BANDAGES/DRESSINGS) ×1 IMPLANT
BNDG ELASTIC 6X5.8 VLCR STR LF (GAUZE/BANDAGES/DRESSINGS) IMPLANT
BNDG ESMARK 6X9 LF (GAUZE/BANDAGES/DRESSINGS)
BNDG GZE 12X3 1 PLY HI ABS (GAUZE/BANDAGES/DRESSINGS) ×1
BNDG STRETCH GAUZE 3IN X12FT (GAUZE/BANDAGES/DRESSINGS) ×1 IMPLANT
BUR MIS CONICAL WEDGE 4.3X13 (BUR) IMPLANT
BURR CONICAL 4.3 (BUR)
BURR MIS CONICAL WEDGE 4.3X13 (BUR)
CHLORAPREP W/TINT 26 (MISCELLANEOUS) ×1 IMPLANT
COVER BACK TABLE 60X90IN (DRAPES) ×1 IMPLANT
CUFF TOURN SGL QUICK 24 (TOURNIQUET CUFF)
CUFF TOURN SGL QUICK 34 (TOURNIQUET CUFF)
CUFF TRNQT CYL 24X4X16.5-23 (TOURNIQUET CUFF) IMPLANT
CUFF TRNQT CYL 34X4.125X (TOURNIQUET CUFF) IMPLANT
DRAPE EXTREMITY T 121X128X90 (DISPOSABLE) ×1 IMPLANT
DRAPE OEC MINIVIEW 54X84 (DRAPES) ×1 IMPLANT
DRAPE U-SHAPE 47X51 STRL (DRAPES) ×1 IMPLANT
DRILL PROFILE CANNULATED 2.5MM (BIT) ×1
DRILL PROFILE CANNULATED 3.4MM (BIT) ×1
DRIVER BIT HEX CANN 1.5 (ORTHOPEDIC DISPOSABLE SUPPLIES) IMPLANT
DRSG MEPITEL 4X7.2 (GAUZE/BANDAGES/DRESSINGS) ×1 IMPLANT
ELECT REM PT RETURN 9FT ADLT (ELECTROSURGICAL) ×1
ELECTRODE REM PT RTRN 9FT ADLT (ELECTROSURGICAL) ×1 IMPLANT
GAUZE PAD ABD 8X10 STRL (GAUZE/BANDAGES/DRESSINGS) ×1 IMPLANT
GAUZE SPONGE 4X4 12PLY STRL (GAUZE/BANDAGES/DRESSINGS) ×1 IMPLANT
GAUZE STRETCH 2X75IN STRL (MISCELLANEOUS) IMPLANT
GLOVE BIO SURGEON STRL SZ8 (GLOVE) ×1 IMPLANT
GLOVE BIOGEL PI IND STRL 7.0 (GLOVE) IMPLANT
GLOVE BIOGEL PI IND STRL 8 (GLOVE) ×2 IMPLANT
GLOVE ECLIPSE 8.0 STRL XLNG CF (GLOVE) ×1 IMPLANT
GLOVE SURG SS PI 6.5 STRL IVOR (GLOVE) IMPLANT
GOWN STRL REUS W/ TWL LRG LVL3 (GOWN DISPOSABLE) ×1 IMPLANT
GOWN STRL REUS W/ TWL XL LVL3 (GOWN DISPOSABLE) ×2 IMPLANT
GOWN STRL REUS W/TWL LRG LVL3 (GOWN DISPOSABLE) ×2
GOWN STRL REUS W/TWL XL LVL3 (GOWN DISPOSABLE) ×2
K-WIRE COCR 0.9X95 (WIRE) ×1
K-WIRE COCR 1.1X105 (WIRE) ×1
K-WIRE DBL .054X9 NSTRL (WIRE) ×1
KWIRE COCR 0.9X95 (WIRE) IMPLANT
KWIRE COCR 1.1X105 (WIRE) IMPLANT
KWIRE DBL .054X9 NSTRL (WIRE) ×1 IMPLANT
NDL HYPO 25X1 1.5 SAFETY (NEEDLE) IMPLANT
NEEDLE HYPO 22GX1.5 SAFETY (NEEDLE) IMPLANT
NEEDLE HYPO 25X1 1.5 SAFETY (NEEDLE) IMPLANT
NS IRRIG 1000ML POUR BTL (IV SOLUTION) ×1 IMPLANT
PACK BASIN DAY SURGERY FS (CUSTOM PROCEDURE TRAY) ×1 IMPLANT
PAD CAST 4YDX4 CTTN HI CHSV (CAST SUPPLIES) ×1 IMPLANT
PADDING CAST ABS COTTON 4X4 ST (CAST SUPPLIES) IMPLANT
PADDING CAST COTTON 4X4 STRL (CAST SUPPLIES) ×1
PADDING CAST COTTON 6X4 STRL (CAST SUPPLIES) IMPLANT
PENCIL SMOKE EVACUATOR (MISCELLANEOUS) ×1 IMPLANT
SANITIZER HAND PURELL 535ML FO (MISCELLANEOUS) ×1 IMPLANT
SCREW CANN MAX VPC 3.4X18 (Screw) IMPLANT
SCREW CANN MAX VPC 3.4X20 (Screw) IMPLANT
SCREW MAX VPC 2.5MMX10MM (Screw) IMPLANT
SCREW VCP 3.4X14 (Screw) IMPLANT
SCREW VPS 3.4X18MM (Screw) ×1 IMPLANT
SCREW VPS 3.4X20MM (Screw) ×2 IMPLANT
SHEET MEDIUM DRAPE 40X70 STRL (DRAPES) ×1 IMPLANT
SLEEVE SCD COMPRESS KNEE MED (STOCKING) ×1 IMPLANT
SPLINT PLASTER CAST FAST 5X30 (CAST SUPPLIES) IMPLANT
SPONGE T-LAP 18X18 ~~LOC~~+RFID (SPONGE) ×1 IMPLANT
STOCKINETTE 6  STRL (DRAPES) ×1
STOCKINETTE 6 STRL (DRAPES) ×1 IMPLANT
SUCTION FRAZIER HANDLE 10FR (MISCELLANEOUS) ×1
SUCTION TUBE FRAZIER 10FR DISP (MISCELLANEOUS) ×1 IMPLANT
SUT ETHILON 3 0 PS 1 (SUTURE) ×1 IMPLANT
SUT MNCRL AB 3-0 PS2 18 (SUTURE) ×1 IMPLANT
SUT VIC AB 2-0 SH 27 (SUTURE) ×1
SUT VIC AB 2-0 SH 27XBRD (SUTURE) IMPLANT
SUT VICRYL 0 SH 27 (SUTURE) IMPLANT
SUT VICRYL 0 UR6 27IN ABS (SUTURE) IMPLANT
SYR BULB EAR ULCER 3OZ GRN STR (SYRINGE) ×1 IMPLANT
SYR CONTROL 10ML LL (SYRINGE) IMPLANT
TOWEL GREEN STERILE FF (TOWEL DISPOSABLE) ×2 IMPLANT
TUBE CONNECTING 20X1/4 (TUBING) IMPLANT
UNDERPAD 30X36 HEAVY ABSORB (UNDERPADS AND DIAPERS) ×1 IMPLANT
YANKAUER SUCT BULB TIP NO VENT (SUCTIONS) IMPLANT

## 2022-09-28 NOTE — Anesthesia Postprocedure Evaluation (Signed)
Anesthesia Post Note  Patient: Tamara Adkins  Procedure(s) Performed: Barbie Banner AND SCARF  OSTEOTOMY BUNION CORRECTION (Left: Foot)     Patient location during evaluation: PACU Anesthesia Type: General Level of consciousness: sedated Pain management: pain level controlled Vital Signs Assessment: post-procedure vital signs reviewed and stable Respiratory status: spontaneous breathing and respiratory function stable Cardiovascular status: stable Postop Assessment: no apparent nausea or vomiting Anesthetic complications: no   No notable events documented.  Last Vitals:  Vitals:   09/28/22 1145 09/28/22 1204  BP: (!) 125/90 131/87  Pulse: 81 96  Resp: 13 16  Temp:  (!) 36.2 C  SpO2: 99% 99%    Last Pain:  Vitals:   09/28/22 1204  TempSrc:   PainSc: 0-No pain                 Joan Herschberger DANIEL

## 2022-09-28 NOTE — Discharge Instructions (Addendum)
Tamara Simmer, MD [facility]  Please read the following information regarding your care after surgery.  Medications  You only need a prescription for the narcotic pain medicine (ex. oxycodone, Percocet, Norco).  All of the other medicines listed below are available over the counter. ? Aleve 2 pills twice a day for the first 3 days after surgery. ? acetominophen (Tylenol) 650 mg every 4-6 hours as you need for minor to moderate pain ? oxycodone as prescribed for severe pain  Narcotic pain medicine (ex. oxycodone, Percocet, Vicodin) will cause constipation.  To prevent this problem, take the following medicines while you are taking any pain medicine. ? docusate sodium (Colace) 100 mg twice a day ? senna (Senokot) 2 tablets twice a day  Weight Bearing ? Bear weight only on your operated foot in the post-op shoe.   Cast / Splint / Dressing ? Keep your splint, cast or dressing clean and dry.  Don't put anything (coat hanger, pencil, etc) down inside of it.  If it gets damp, use a hair dryer on the cool setting to dry it.  If it gets soaked, call the office to schedule an appointment for a cast change.  After your dressing, cast or splint is removed; you may shower, but do not soak or scrub the wound.  Allow the water to run over it, and then gently pat it dry.  Swelling It is normal for you to have swelling where you had surgery.  To reduce swelling and pain, keep your toes above your nose for at least 3 days after surgery.  It may be necessary to keep your foot or leg elevated for several weeks.  If it hurts, it should be elevated.  Follow Up Call my office at [phone] when you are discharged from the hospital or surgery center to schedule an appointment to be seen two weeks after surgery.  Call my office at [phone] if you develop a fever >101.5 F, nausea, vomiting, bleeding from the surgical site or severe pain.     May take Tylenol after 2pm, if needed.  May take NSAIDS  (ibuprofen, motrin) after 2pm, if needed.    Post Anesthesia Home Care Instructions  Activity: Get plenty of rest for the remainder of the day. A responsible individual must stay with you for 24 hours following the procedure.  For the next 24 hours, DO NOT: -Drive a car -Paediatric nurse -Drink alcoholic beverages -Take any medication unless instructed by your physician -Make any legal decisions or sign important papers.  Meals: Start with liquid foods such as gelatin or soup. Progress to regular foods as tolerated. Avoid greasy, spicy, heavy foods. If nausea and/or vomiting occur, drink only clear liquids until the nausea and/or vomiting subsides. Call your physician if vomiting continues.  Special Instructions/Symptoms: Your throat may feel dry or sore from the anesthesia or the breathing tube placed in your throat during surgery. If this causes discomfort, gargle with warm salt water. The discomfort should disappear within 24 hours.  If you had a scopolamine patch placed behind your ear for the management of post- operative nausea and/or vomiting:  1. The medication in the patch is effective for 72 hours, after which it should be removed.  Wrap patch in a tissue and discard in the trash. Wash hands thoroughly with soap and water. 2. You may remove the patch earlier than 72 hours if you experience unpleasant side effects which may include dry mouth, dizziness or visual disturbances. 3. Avoid touching the patch. Wash  your hands with soap and water after contact with the patch.    Regional Anesthesia Blocks  1. Numbness or the inability to move the "blocked" extremity may last from 3-48 hours after placement. The length of time depends on the medication injected and your individual response to the medication. If the numbness is not going away after 48 hours, call your surgeon.  2. The extremity that is blocked will need to be protected until the numbness is gone and the  Strength has  returned. Because you cannot feel it, you will need to take extra care to avoid injury. Because it may be weak, you may have difficulty moving it or using it. You may not know what position it is in without looking at it while the block is in effect.  3. For blocks in the legs and feet, returning to weight bearing and walking needs to be done carefully. You will need to wait until the numbness is entirely gone and the strength has returned. You should be able to move your leg and foot normally before you try and bear weight or walk. You will need someone to be with you when you first try to ensure you do not fall and possibly risk injury.  4. Bruising and tenderness at the needle site are common side effects and will resolve in a few days.  5. Persistent numbness or new problems with movement should be communicated to the surgeon or the [facility] [phone] [place] [phone]). Information for Discharge Teaching: EXPAREL (bupivacaine liposome injectable suspension)   Your surgeon or anesthesiologist gave you EXPAREL(bupivacaine) to help control your pain after surgery.  EXPAREL is a local anesthetic that provides pain relief by numbing the tissue around the surgical site. EXPAREL is designed to release pain medication over time and can control pain for up to 72 hours. Depending on how you respond to EXPAREL, you may require less pain medication during your recovery.  Possible side effects: Temporary loss of sensation or ability to move in the area where bupivacaine was injected. Nausea, vomiting, constipation Rarely, numbness and tingling in your mouth or lips, lightheadedness, or anxiety may occur. Call your doctor right away if you think you may be experiencing any of these sensations, or if you have other questions regarding possible side effects.  Follow all other discharge instructions given to you by your surgeon or nurse. Eat a healthy diet and drink  plenty of water or other fluids.  If you return to the hospital for any reason within 96 hours following the administration of EXPAREL, it is important for health care providers to know that you have received this anesthetic. A teal colored band has been placed on your arm with the date, time and amount of EXPAREL you have received in order to alert and inform your health care providers. Please leave this armband in place for the full 96 hours following administration, and then you may remove the band.

## 2022-09-28 NOTE — Transfer of Care (Signed)
Immediate Anesthesia Transfer of Care Note  Patient: Tamara Adkins  Procedure(s) Performed: Barbie Banner AND SCARF  OSTEOTOMY BUNION CORRECTION (Left: Foot)  Patient Location: PACU  Anesthesia Type:GA combined with regional for post-op pain  Level of Consciousness: drowsy and patient cooperative  Airway & Oxygen Therapy: Patient Spontanous Breathing and Patient connected to face mask oxygen  Post-op Assessment: Report given to RN and Post -op Vital signs reviewed and stable  Post vital signs: Reviewed and stable  Last Vitals:  Vitals Value Taken Time  BP 123/94 09/28/22 1124  Temp    Pulse 93 09/28/22 1125  Resp 16 09/28/22 1125  SpO2 100 % 09/28/22 1125  Vitals shown include unvalidated device data.  Last Pain:  Vitals:   09/28/22 0751  TempSrc: Oral  PainSc: 0-No pain      Patients Stated Pain Goal: 3 (97/28/20 6015)  Complications: No notable events documented.

## 2022-09-28 NOTE — Op Note (Signed)
09/28/2022  11:19 AM  PATIENT:  Tamara Adkins  56 y.o. female  PRE-OPERATIVE DIAGNOSIS:  hallux valgus left foot  POST-OPERATIVE DIAGNOSIS:  hallux valgus left foot  Procedure(s): 1.  Correction of left foot bunion deformity with double osteotomy (scarf and Akin) 2.  AP, lateral and oblique radiographs of the left foot  SURGEON:  Wylene Simmer, MD  ASSISTANT: Mechele Claude, PA-C  ANESTHESIA:   General, regional  EBL:  minimal   TOURNIQUET:   Total Tourniquet Time Documented: Thigh (Left) - 48 minutes Total: Thigh (Left) - 48 minutes  COMPLICATIONS:  None apparent  DISPOSITION:  Extubated, awake and stable to recovery.  INDICATION FOR PROCEDURE: 56 year old female with painful left foot bunion deformity has failed nonoperative treatment to date.  She presents today for surgical correction of her painful left foot bunion deformity.  The risks and benefits of the alternative treatment options have been discussed in detail.  The patient wishes to proceed with surgery and specifically understands risks of bleeding, infection, nerve damage, blood clots, need for additional surgery, amputation and death.   PROCEDURE IN DETAIL:  After pre operative consent was obtained, and the correct operative site was identified, the patient was brought to the operating room and placed supine on the OR table.  Anesthesia was administered.  Pre-operative antibiotics were administered.  A surgical timeout was taken.  The left lower extremity was prepped and draped in standard sterile fashion with a tourniquet around the thigh.  The extremity was elevated, and the tourniquet was inflated to 250 mmHg.  A longitudinal incision was made to the dorsum of the first webspace.  Dissection was carried sharply down through the subcutaneous tissues.  The intermetatarsal ligament was divided.  An arthrotomy was then made between the lateral sesamoid and the metatarsal head.  The hallux could then be positioned in 20  degrees of varus passively.  Attention was turned to the medial forefoot where a longitudinal incision was made along the proximal phalanx of the hallux and over the medial eminence along the first metatarsal shaft.  Dissection was carried sharply down through the subcutaneous tissues.  The medial joint capsule was incised and elevated dorsally and plantarly.  The hypertrophic medial eminence was resected with the oscillating saw in line with the first metatarsal shaft.  A scarf osteotomy was then made in the first metatarsal.  The osteotomy was mobilized and the head of the metatarsal translated laterally to correct the hallux valgus and intermetatarsal angles.  Radiographs confirmed appropriate correction of the deformity.  The osteotomy was then fixed with a 3.4 mm VPC screw distally and a 2.5 mm VPC screw proximally.  These were [employer] screws.  Overhanging bone was trimmed with the oscillating saw.  Radiographs confirmed appropriate correction of the deformity.  The patient was noted to have residual hallux valgus interphalangeus.  The decision was made to proceed with an Aiken osteotomy.  Dorsal and plantar subperiosteal dissection was then carried around the metaphyseal flare.  A guidepin was placed in the base of the proximal phalanx.  A closing wedge osteotomy was made distal to the guidepin.  The osteotomy was closed and fixed with a 3.4 mm VPC screw.  AP, lateral and oblique radiographs confirmed appropriate correction of the bunion deformity in appropriate position and length of the hardware.  The wounds were then irrigated copiously and sprinkled with vancomycin powder.  The medial joint capsule was repaired with imbricating sutures of 2-0 Vicryl.  Subcutaneous tissues were approximated with  2-0 Vicryl.  The skin incision was closed with 3-0 nylon.  The dorsal incision was also closed with nylon.  Sterile dressings were applied followed by a bunion dressing and a compression wrap.  The  tourniquet was released after application of the dressings.  The patient was awakened from anesthesia and transported to the recovery room in stable condition.  FOLLOW UP PLAN: Weightbearing as tolerated on the heel in a Darco shoe.  Follow-up in the office in 2 weeks for suture removal and conversion to a flat postop shoe with a toe spacer.  No indication for DVT prophylaxis in this ambulatory patient.  RADIOGRAPHS: AP, lateral and oblique radiographs of the left foot are obtained intraoperatively.  These show interval correction of hallux valgus and intermetatarsal angles with first metatarsal and proximal phalanx osteotomies.  Hardware is appropriately positioned and of the appropriate lengths.  No other acute injuries are noted.    Mechele Claude PA-C was present and scrubbed for the duration of the operative case. His assistance was essential in positioning the patient, prepping and draping, gaining and maintaining exposure, performing the operation, closing and dressing the wounds and applying the splint.

## 2022-09-28 NOTE — H&P (Signed)
Tamara Adkins is an 56 y.o. female.   Chief Complaint: Left foot pain HPI: 56 year old female without significant past medical history has a long history of left forefoot pain due to a prominent bunion deformity.  She had right foot bunion correction earlier this year and has done well.  She presents now for left foot bunion correction having failed nonoperative treatment to date including activity modification, oral anti-inflammatories and shoewear modification. Past Medical History:  Diagnosis Date   Fibroids    Hypothyroidism    PONV (postoperative nausea and vomiting)    Thyroid disease     Past Surgical History:  Procedure Laterality Date   AUGMENTATION MAMMAPLASTY Bilateral    BUNIONECTOMY Right 06/01/2022   Procedure: Right 1st metatarsal scarf osteotomy and proximal phalanx akin osteotomy;  Surgeon: Wylene Simmer, MD;  Location: [facility];  Service: Orthopedics;  Laterality: Right;    Family History  Problem Relation Age of Onset   Hyperlipidemia Mother    Hypertension Mother    Hyperlipidemia Father    Hypertension Father    Stroke Father    Other Brother 53       Car Accident   Heart attack Maternal Grandmother    Thyroid disease Maternal Grandmother        hypothroid   Heart attack Paternal Grandmother    Breast cancer Neg Hx    Social History:  reports that she has never smoked. She has never used smokeless tobacco. She reports that she does not currently use alcohol. She reports that she does not currently use drugs.  Allergies:  Allergies  Allergen Reactions   Hydrocodone Itching   Wound Dressing Adhesive Rash    Bandaids, tape    Medications Prior to Admission  Medication Sig Dispense Refill   docusate sodium (COLACE) 100 MG capsule Take 1 capsule (100 mg total) by mouth 2 (two) times daily. While taking narcotic pain medicine. 30 capsule 0   levothyroxine (SYNTHROID) 25 MCG tablet TAKE 1 &1/2 TABLETS DAILY BEFORE BREAKFAST (Patient taking  differently: 37.5 mcg. TAKE 1 &1/2 TABLETS DAILY BEFORE BREAKFAST) 135 tablet 1    No results found for this or any previous visit (from the past 48 hour(s)). No results found.  Review of Systems no recent fever, chills, nausea, vomiting or changes in her appetite  Blood pressure (!) 140/90, pulse 82, temperature 97.9 F (36.6 C), temperature source Oral, resp. rate 16, height '5\' 2"'$  (1.575 m), weight 53.3 kg, last menstrual period 04/18/2018, SpO2 96 %. Physical Exam  Well-nourished well-developed woman in no apparent distress.  Alert and oriented x4.  Normal mood and affect.  Extraocular motions are intact.  Respirations are unlabored.  Gait is normal.  The left foot has a moderate bunion deformity.  Skin is healthy.  Pulses are palpable in the foot.  No lymphadenopathy.   Assessment/Plan Painful left foot bunion deformity -to the operating room today for bunion correction with double osteotomy.  The risks and benefits of the alternative treatment options have been discussed in detail.  The patient wishes to proceed with surgery and specifically understands risks of bleeding, infection, nerve damage, blood clots, need for additional surgery, amputation and death.   Wylene Simmer, MD Oct 25, 2022, 9:59 AM

## 2022-09-28 NOTE — Anesthesia Procedure Notes (Signed)
Procedure Name: LMA Insertion Date/Time: 09/28/2022 10:42 AM  Performed by: Tawni Millers, CRNAPre-anesthesia Checklist: Patient identified, Emergency Drugs available, Suction available and Patient being monitored Patient Re-evaluated:Patient Re-evaluated prior to induction Oxygen Delivery Method: Circle system utilized Preoxygenation: Pre-oxygenation with 100% oxygen Induction Type: IV induction Ventilation: Mask ventilation without difficulty LMA: LMA inserted LMA Size: 4.0 Number of attempts: 1 Airway Equipment and Method: Bite block Placement Confirmation: positive ETCO2 Tube secured with: Tape Dental Injury: Teeth and Oropharynx as per pre-operative assessment

## 2022-09-28 NOTE — Anesthesia Procedure Notes (Signed)
Anesthesia Regional Block: Popliteal block   Pre-Anesthetic Checklist: , timeout performed,  Correct Patient, Correct Site, Correct Laterality,  Correct Procedure, Correct Position, site marked,  Risks and benefits discussed,  Surgical consent,  Pre-op evaluation,  At surgeon's request and post-op pain management  Laterality: Left  Prep: chloraprep       Needles:  Injection technique: Single-shot  Needle Type: Echogenic Stimulator Needle          Additional Needles:   Procedures:,,,, ultrasound used (permanent image in chart),,    Narrative:  Start time: 09/28/2022 8:17 AM End time: 09/28/2022 8:27 AM Injection made incrementally with aspirations every 5 mL.  Performed by: Personally  Anesthesiologist: Duane Boston, MD  Additional Notes: A functioning IV was confirmed and monitors were applied.  Sterile prep and drape, hand hygiene and sterile gloves were used.  Negative aspiration and test dose prior to incremental administration of local anesthetic. The patient tolerated the procedure well.Ultrasound  guidance: relevant anatomy identified, needle position confirmed, local anesthetic spread visualized around nerve(s), vascular puncture avoided.  Image printed for medical record. ACB supplement.

## 2022-09-28 NOTE — Progress Notes (Signed)
Assisted Dr. Tobias Alexander with left, popliteal/saphenous, ultrasound guided block. Side rails up, monitors on throughout procedure. See vital signs in flow sheet. Tolerated Procedure well.

## 2022-09-29 ENCOUNTER — Encounter (HOSPITAL_BASED_OUTPATIENT_CLINIC_OR_DEPARTMENT_OTHER): Payer: Self-pay | Admitting: Orthopedic Surgery

## 2022-10-02 ENCOUNTER — Other Ambulatory Visit: Payer: Self-pay | Admitting: Endocrinology

## 2022-10-06 ENCOUNTER — Other Ambulatory Visit: Payer: Self-pay

## 2022-10-06 DIAGNOSIS — Z0000 Encounter for general adult medical examination without abnormal findings: Secondary | ICD-10-CM

## 2022-10-06 DIAGNOSIS — Z130 Encounter for screening for diseases of the blood and blood-forming organs and certain disorders involving the immune mechanism: Secondary | ICD-10-CM

## 2022-10-06 NOTE — Progress Notes (Signed)
TSH 

## 2022-10-09 ENCOUNTER — Other Ambulatory Visit: Payer: BC Managed Care – PPO

## 2022-10-09 DIAGNOSIS — Z0000 Encounter for general adult medical examination without abnormal findings: Secondary | ICD-10-CM | POA: Diagnosis not present

## 2022-10-09 DIAGNOSIS — Z1329 Encounter for screening for other suspected endocrine disorder: Secondary | ICD-10-CM

## 2022-10-09 DIAGNOSIS — E785 Hyperlipidemia, unspecified: Secondary | ICD-10-CM | POA: Diagnosis not present

## 2022-10-09 DIAGNOSIS — Z131 Encounter for screening for diabetes mellitus: Secondary | ICD-10-CM | POA: Diagnosis not present

## 2022-10-09 DIAGNOSIS — Z130 Encounter for screening for diseases of the blood and blood-forming organs and certain disorders involving the immune mechanism: Secondary | ICD-10-CM

## 2022-10-10 LAB — LIPID PANEL
Chol/HDL Ratio: 3.7 ratio (ref 0.0–4.4)
Cholesterol, Total: 253 mg/dL — ABNORMAL HIGH (ref 100–199)
HDL: 69 mg/dL (ref >39)
LDL Chol Calc (NIH): 165 mg/dL — ABNORMAL HIGH (ref 0–99)
Triglycerides: 107 mg/dL (ref 0–149)
VLDL Cholesterol Cal: 19 mg/dL (ref 5–40)

## 2022-10-10 LAB — CBC
Hematocrit: 36.9 % (ref 34.0–46.6)
Hemoglobin: 12.5 g/dL (ref 11.1–15.9)
MCH: 29.8 pg (ref 26.6–33.0)
MCHC: 33.9 g/dL (ref 31.5–35.7)
MCV: 88 fL (ref 79–97)
Platelets: 255 10*3/uL (ref 150–450)
RBC: 4.19 x10E6/uL (ref 3.77–5.28)
RDW: 12.3 % (ref 11.7–15.4)
WBC: 6.1 10*3/uL (ref 3.4–10.8)

## 2022-10-10 LAB — COMPREHENSIVE METABOLIC PANEL
ALT: 42 IU/L — ABNORMAL HIGH (ref 0–32)
AST: 37 IU/L (ref 0–40)
Albumin/Globulin Ratio: 1.9 (ref 1.2–2.2)
Albumin: 4.6 g/dL (ref 3.8–4.9)
Alkaline Phosphatase: 126 IU/L — ABNORMAL HIGH (ref 44–121)
BUN/Creatinine Ratio: 18 (ref 9–23)
BUN: 14 mg/dL (ref 6–24)
Bilirubin Total: 0.5 mg/dL (ref 0.0–1.2)
CO2: 25 mmol/L (ref 20–29)
Calcium: 9.7 mg/dL (ref 8.7–10.2)
Chloride: 99 mmol/L (ref 96–106)
Creatinine, Ser: 0.8 mg/dL (ref 0.57–1.00)
Globulin, Total: 2.4 g/dL (ref 1.5–4.5)
Glucose: 93 mg/dL (ref 70–99)
Potassium: 4.1 mmol/L (ref 3.5–5.2)
Sodium: 138 mmol/L (ref 134–144)
Total Protein: 7 g/dL (ref 6.0–8.5)
eGFR: 86 mL/min/{1.73_m2} (ref >59)

## 2022-10-10 LAB — HEMOGLOBIN A1C
Est. average glucose Bld gHb Est-mCnc: 108 mg/dL
Hgb A1c MFr Bld: 5.4 % (ref 4.8–5.6)

## 2022-10-16 ENCOUNTER — Encounter: Payer: BC Managed Care – PPO | Admitting: Nurse Practitioner

## 2022-10-17 NOTE — Progress Notes (Deleted)
Complete physical exam   Patient: Tamara Adkins   DOB: 1966-04-24   56 y.o. Female  MRN: [id number] Visit Date: 10/18/2022    No chief complaint on file.  Subjective    ECHO PROPP is a 56 y.o. female who presents today for a complete physical exam.  She reports consuming a {diet types:17450} diet. {Exercise:19826} She generally feels {well/fairly well/poorly:18703}. She {does/does not:200015} have additional problems to discuss today.   HPI  Annual physical  -routine fasting labs done prior to this visit  --mild elevation of LDL and total cholesterol  --mild elevation of ALT and Alk Phos.  --other labs normal  -recent bunion removal  -does have GYN provider   Past Medical History:  Diagnosis Date   Fibroids    Hypothyroidism    PONV (postoperative nausea and vomiting)    Thyroid disease    Past Surgical History:  Procedure Laterality Date   AUGMENTATION MAMMAPLASTY Bilateral    BUNIONECTOMY Right 06/01/2022   Procedure: Right 1st metatarsal scarf osteotomy and proximal phalanx akin osteotomy;  Surgeon: Wylene Simmer, MD;  Location: [place];  Service: Orthopedics;  Laterality: Right;   METATARSAL OSTEOTOMY WITH BUNIONECTOMY Left 09/28/2022   Procedure: Barbie Banner AND SCARF  OSTEOTOMY BUNION CORRECTION;  Surgeon: Wylene Simmer, MD;  Location: [place];  Service: Orthopedics;  Laterality: Left;   Social History   Socioeconomic History   Marital status: Married    Spouse name: Pearline Cables   Number of children: 1   Years of education: Not on file   Highest education level: Not on file  Occupational History   Occupation: Hairdresser  Tobacco Use   Smoking status: Never   Smokeless tobacco: Never  Vaping Use   Vaping Use: Never used  Substance and Sexual Activity   Alcohol use: Not Currently   Drug use: Not Currently   Sexual activity: Yes    Partners: Male  Other Topics Concern   Not on file  Social History Narrative   Not on file    Social Determinants of Health   Financial Resource Strain: Low Risk  (04/10/2022)   Overall Financial Resource Strain (CARDIA)    Difficulty of Paying Living Expenses: Not very hard  Food Insecurity: No Food Insecurity (04/10/2022)   Hunger Vital Sign    Worried About Running Out of Food in the Last Year: Never true    Ran Out of Food in the Last Year: Never true  Transportation Needs: No Transportation Needs (04/10/2022)   PRAPARE - Hydrologist (Medical): No    Lack of Transportation (Non-Medical): No  Physical Activity: Sufficiently Active (04/10/2022)   Exercise Vital Sign    Days of Exercise per Week: 3 days    Minutes of Exercise per Session: 60 min  Stress: No Stress Concern Present (04/10/2022)   [place]    Feeling of Stress : Only a little  Social Connections: Socially Integrated (04/10/2022)   Social Connection and Isolation Panel [NHANES]    Frequency of Communication with Friends and Family: More than three times a week    Frequency of Social Gatherings with Friends and Family: More than three times a week    Attends Religious Services: More than 4 times per year    Active Member of [employer] or Organizations: Yes    Attends Archivist Meetings: 1 to 4 times per year    Marital Status: Married  Intimate Partner Violence: Not At Risk (04/10/2022)   Humiliation, Afraid, Rape, and Kick questionnaire    Fear of Current or Ex-Partner: No    Emotionally Abused: No    Physically Abused: No    Sexually Abused: No   Family Status  Relation Name Status   Mother  Alive   Father  Alive   Brother  Deceased   MGM  (Not Specified)   PGM  (Not Specified)   Neg Hx  (Not Specified)   Family History  Problem Relation Age of Onset   Hyperlipidemia Mother    Hypertension Mother    Hyperlipidemia Father    Hypertension Father    Stroke Father    Other Brother 22       Car Accident    Heart attack Maternal Grandmother    Thyroid disease Maternal Grandmother        hypothroid   Heart attack Paternal Grandmother    Breast cancer Neg Hx    Allergies  Allergen Reactions   Hydrocodone Itching   Wound Dressing Adhesive Rash    Bandaids, tape    Patient Care Team: Ronnell Freshwater, NP as PCP - General (Family Medicine)   Medications: Outpatient Medications Prior to Visit  Medication Sig   docusate sodium (COLACE) 100 MG capsule Take 1 capsule (100 mg total) by mouth 2 (two) times daily. While taking narcotic pain medicine.   levothyroxine (SYNTHROID) 25 MCG tablet TAKE 1 &1/2 TABLETS DAILY BEFORE BREAKFAST   senna (SENOKOT) 8.6 MG TABS tablet Take 2 tablets (17.2 mg total) by mouth 2 (two) times daily.   No facility-administered medications prior to visit.    Review of Systems  {Labs (Optional):23779}   Objective    There were no vitals filed for this visit. There is no height or weight on file to calculate BMI.  BP Readings from Last 3 Encounters:  09/28/22 131/87  07/17/22 112/78  06/01/22 126/90    Wt Readings from Last 3 Encounters:  09/28/22 117 lb 8.1 oz (53.3 kg)  07/17/22 118 lb (53.5 kg)  06/01/22 117 lb 1 oz (53.1 kg)     Physical Exam  ***  Last depression screening scores   Row Labels 04/10/2022   10:27 AM  PHQ 2/9 Scores   Section Header. No data exists in this row.   PHQ - 2 Score   0  PHQ- 9 Score   0   Last fall risk screening   Row Labels 04/10/2022   10:27 AM  Fall Risk    Section Header. No data exists in this row.   Falls in the past year?   0  Number falls in past yr:   0  Injury with Fall?   0  Risk for fall due to :   No Fall Risks  Follow up   Falls evaluation completed   Last Audit-C alcohol use screening   Row Labels 04/10/2022   10:32 AM  Alcohol Use Disorder Test (AUDIT)   Section Header. No data exists in this row.   1. How often do you have a drink containing alcohol?   1  2. How many drinks containing  alcohol do you have on a typical day when you are drinking?   0  3. How often do you have six or more drinks on one occasion?   0  AUDIT-C Score   1   A score of 3 or more in women, and 4 or more in men indicates  increased risk for alcohol abuse, EXCEPT if all of the points are from question 1   No results found for any visits on 10/18/22.  Assessment & Plan    Routine Health Maintenance and Physical Exam  Exercise Activities and Dietary recommendations  Goals   None     Immunization History  Administered Date(s) Administered   Influenza-Unspecified 09/10/2018   PFIZER(Purple Top)SARS-COV-2 Vaccination 01/21/2020, 02/09/2020    Health Maintenance  Topic Date Due   HIV Screening  Never done   PAP SMEAR-Modifier  Never done   COLONOSCOPY (Pts 45-46yr Insurance coverage will need to be confirmed)  Never done   Zoster Vaccines- Shingrix (2 of 2) 10/01/2017   COVID-19 Vaccine (6 - Pfizer series) 12/22/2020   INFLUENZA VACCINE  07/11/2022   MAMMOGRAM  02/14/2024   TETANUS/TDAP  12/20/2031   Hepatitis C Screening  Completed   HPV VACCINES  Aged Out    Discussed health benefits of physical activity, and encouraged her to engage in regular exercise appropriate for her age and condition.  Problem List Items Addressed This Visit   None    No follow-ups on file.        HRonnell Freshwater NP  [phone](phone) [phone](fax)  [place]

## 2022-10-18 ENCOUNTER — Encounter: Payer: BC Managed Care – PPO | Admitting: Nurse Practitioner

## 2022-10-25 ENCOUNTER — Ambulatory Visit (INDEPENDENT_AMBULATORY_CARE_PROVIDER_SITE_OTHER): Payer: BC Managed Care – PPO | Admitting: Nurse Practitioner

## 2022-10-25 ENCOUNTER — Encounter: Payer: Self-pay | Admitting: Nurse Practitioner

## 2022-10-25 VITALS — BP 113/75 | HR 90 | Ht 62.0 in | Wt 117.0 lb

## 2022-10-25 DIAGNOSIS — Z23 Encounter for immunization: Secondary | ICD-10-CM | POA: Diagnosis not present

## 2022-10-25 DIAGNOSIS — E785 Hyperlipidemia, unspecified: Secondary | ICD-10-CM | POA: Diagnosis not present

## 2022-10-25 DIAGNOSIS — E039 Hypothyroidism, unspecified: Secondary | ICD-10-CM

## 2022-10-25 DIAGNOSIS — Z0001 Encounter for general adult medical examination with abnormal findings: Secondary | ICD-10-CM | POA: Diagnosis not present

## 2022-10-25 NOTE — Progress Notes (Signed)
Complete physical exam   Patient: Tamara Adkins   DOB: [birth year]   56 y.o. Female  MRN: [id number] Visit Date: 10/25/2022    Chief Complaint  Patient presents with   Annual Exam   Subjective    Tamara Adkins is a 56 y.o. female who presents today for a complete physical exam.  She reports consuming a  generally healthy  diet. Exercise is limited by orthopedic condition(s): recent bunionectomy. She generally feels well. She does not have additional problems to discuss today.   HPI   Annual physical  -routine, fasting labs done prior to today  --mild elevation of ALT and Alk Phos --was taking scheduled NSAIDs after bunionectomy two weeks prior to labs  -will recheck this in approximately two months  -she denies abdominal pain, nausea, vomiting, or diarrhea  --mild elevation of LDL with remainder of lipid panel normal  -?flu vaccine -sees endocrinology for thyroid  -sees GYN provider -had initial bunion surgery on right foot in 05/2022 and had left foot done 09/28/2022. She is currently in post operative shoe on left side   Past Medical History:  Diagnosis Date   Fibroids    Hypothyroidism    PONV (postoperative nausea and vomiting)    Thyroid disease    Past Surgical History:  Procedure Laterality Date   AUGMENTATION MAMMAPLASTY Bilateral    BUNIONECTOMY Right 06/01/2022   Procedure: Right 1st metatarsal scarf osteotomy and proximal phalanx akin osteotomy;  Surgeon: Wylene Simmer, MD;  Location: [place];  Service: Orthopedics;  Laterality: Right;   METATARSAL OSTEOTOMY WITH BUNIONECTOMY Left 09/28/2022   Procedure: Barbie Banner AND SCARF  OSTEOTOMY BUNION CORRECTION;  Surgeon: Wylene Simmer, MD;  Location: [place];  Service: Orthopedics;  Laterality: Left;   Social History   Socioeconomic History   Marital status: Married    Spouse name: Pearline Cables   Number of children: 1   Years of education: Not on file   Highest education level: Not on file   Occupational History   Occupation: Hairdresser  Tobacco Use   Smoking status: Never   Smokeless tobacco: Never  Vaping Use   Vaping Use: Never used  Substance and Sexual Activity   Alcohol use: Not Currently   Drug use: Not Currently   Sexual activity: Yes    Partners: Male  Other Topics Concern   Not on file  Social History Narrative   Not on file   Social Determinants of Health   Financial Resource Strain: Low Risk  (04/10/2022)   Overall Financial Resource Strain (CARDIA)    Difficulty of Paying Living Expenses: Not very hard  Food Insecurity: No Food Insecurity (04/10/2022)   Hunger Vital Sign    Worried About Running Out of Food in the Last Year: Never true    Ran Out of Food in the Last Year: Never true  Transportation Needs: No Transportation Needs (04/10/2022)   PRAPARE - Hydrologist (Medical): No    Lack of Transportation (Non-Medical): No  Physical Activity: Sufficiently Active (04/10/2022)   Exercise Vital Sign    Days of Exercise per Week: 3 days    Minutes of Exercise per Session: 60 min  Stress: No Stress Concern Present (04/10/2022)   [place]    Feeling of Stress : Only a little  Social Connections: Socially Integrated (04/10/2022)   Social Connection and Isolation Panel [NHANES]    Frequency of Communication with Friends  and Family: More than three times a week    Frequency of Social Gatherings with Friends and Family: More than three times a week    Attends Religious Services: More than 4 times per year    Active Member of [employer] or Organizations: Yes    Attends Archivist Meetings: 1 to 4 times per year    Marital Status: Married  Human resources officer Violence: Not At Risk (04/10/2022)   Humiliation, Afraid, Rape, and Kick questionnaire    Fear of Current or Ex-Partner: No    Emotionally Abused: No    Physically Abused: No    Sexually Abused: No   Family  Status  Relation Name Status   Mother  Alive   Father  Alive   Brother  Deceased   MGM  (Not Specified)   PGM  (Not Specified)   Neg Hx  (Not Specified)   Family History  Problem Relation Age of Onset   Hyperlipidemia Mother    Hypertension Mother    Hyperlipidemia Father    Hypertension Father    Stroke Father    Other Brother 11       Car Accident   Heart attack Maternal Grandmother    Thyroid disease Maternal Grandmother        hypothroid   Heart attack Paternal Grandmother    Breast cancer Neg Hx    Allergies  Allergen Reactions   Hydrocodone Itching   Wound Dressing Adhesive Rash    Bandaids, tape    Patient Care Team: Ronnell Freshwater, NP as PCP - General (Family Medicine)   Medications: Outpatient Medications Prior to Visit  Medication Sig   docusate sodium (COLACE) 100 MG capsule Take 1 capsule (100 mg total) by mouth 2 (two) times daily. While taking narcotic pain medicine.   levothyroxine (SYNTHROID) 25 MCG tablet TAKE 1 &1/2 TABLETS DAILY BEFORE BREAKFAST   [DISCONTINUED] senna (SENOKOT) 8.6 MG TABS tablet Take 2 tablets (17.2 mg total) by mouth 2 (two) times daily.   No facility-administered medications prior to visit.    Review of Systems  Constitutional:  Negative for activity change, appetite change, chills, fatigue and fever.  HENT:  Negative for congestion, postnasal drip, rhinorrhea, sinus pressure, sinus pain, sneezing and sore throat.   Eyes: Negative.   Respiratory:  Negative for cough, chest tightness, shortness of breath and wheezing.   Cardiovascular:  Negative for chest pain and palpitations.  Gastrointestinal:  Negative for abdominal pain, constipation, diarrhea, nausea and vomiting.  Endocrine: Negative for cold intolerance, heat intolerance, polydipsia and polyuria.  Genitourinary:  Negative for dyspareunia, dysuria, flank pain, frequency and urgency.  Musculoskeletal:  Positive for arthralgias. Negative for back pain and myalgias.        Left foot pain. Currently in post operative shoe.   Skin:  Negative for rash.  Allergic/Immunologic: Negative for environmental allergies.  Neurological:  Negative for dizziness, weakness and headaches.  Hematological:  Negative for adenopathy.  Psychiatric/Behavioral:  The patient is not nervous/anxious.     Last CBC Lab Results  Component Value Date   WBC 6.1 10/09/2022   HGB 12.5 10/09/2022   HCT 36.9 10/09/2022   MCV 88 10/09/2022   MCH 29.8 10/09/2022   RDW 12.3 10/09/2022   PLT 255 16/55/3748   Last metabolic panel Lab Results  Component Value Date   GLUCOSE 93 10/09/2022   NA 138 10/09/2022   K 4.1 10/09/2022   CL 99 10/09/2022   CO2 25 10/09/2022   BUN  14 10/09/2022   CREATININE 0.80 10/09/2022   EGFR 86 10/09/2022   CALCIUM 9.7 10/09/2022   PROT 7.0 10/09/2022   ALBUMIN 4.6 10/09/2022   LABGLOB 2.4 10/09/2022   AGRATIO 1.9 10/09/2022   BILITOT 0.5 10/09/2022   ALKPHOS 126 (H) 10/09/2022   AST 37 10/09/2022   ALT 42 (H) 10/09/2022   Last lipids Lab Results  Component Value Date   CHOL 253 (H) 10/09/2022   HDL 69 10/09/2022   LDLCALC 165 (H) 10/09/2022   TRIG 107 10/09/2022   CHOLHDL 3.7 10/09/2022   Last hemoglobin A1c Lab Results  Component Value Date   HGBA1C 5.4 10/09/2022   Last thyroid functions Lab Results  Component Value Date   TSH 2.54 07/04/2021        Objective     Today's Vitals   10/25/22 1432  BP: 113/75  Pulse: 90  SpO2: 99%  Weight: 117 lb (53.1 kg)  Height: _0  (1.575 m)   Body mass index is 21.4 kg/m.  BP Readings from Last 3 Encounters:  10/25/22 113/75  09/28/22 131/87  07/17/22 112/78    Wt Readings from Last 3 Encounters:  10/25/22 117 lb (53.1 kg)  09/28/22 117 lb 8.1 oz (53.3 kg)  07/17/22 118 lb (53.5 kg)     Physical Exam Vitals and nursing note reviewed.  Constitutional:      Appearance: Normal appearance. She is well-developed.  HENT:     Head: Normocephalic and atraumatic.     Right  Ear: Tympanic membrane, ear canal and external ear normal.     Left Ear: Tympanic membrane, ear canal and external ear normal.     Nose: Nose normal.     Mouth/Throat:     Mouth: Mucous membranes are moist.     Pharynx: Oropharynx is clear.  Eyes:     Extraocular Movements: Extraocular movements intact.     Conjunctiva/sclera: Conjunctivae normal.     Pupils: Pupils are equal, round, and reactive to light.  Neck:     Vascular: No carotid bruit.  Cardiovascular:     Rate and Rhythm: Normal rate and regular rhythm.     Pulses: Normal pulses.     Heart sounds: Normal heart sounds.  Pulmonary:     Effort: Pulmonary effort is normal.     Breath sounds: Normal breath sounds.  Abdominal:     General: Bowel sounds are normal. There is no distension.     Palpations: Abdomen is soft. There is no mass.     Tenderness: There is no abdominal tenderness. There is no right CVA tenderness, left CVA tenderness, guarding or rebound.     Hernia: No hernia is present.  Musculoskeletal:        General: Normal range of motion.     Cervical back: Normal range of motion and neck supple.  Lymphadenopathy:     Cervical: No cervical adenopathy.  Skin:    General: Skin is warm and dry.     Capillary Refill: Capillary refill takes less than 2 seconds.  Neurological:     General: No focal deficit present.     Mental Status: She is alert and oriented to person, place, and time.  Psychiatric:        Mood and Affect: Mood normal.        Behavior: Behavior normal.        Thought Content: Thought content normal.        Judgment: Judgment normal.  Last depression screening scores   Row Labels 10/25/2022    2:40 PM 04/10/2022   10:27 AM  PHQ 2/9 Scores   Section Header. No data exists in this row.    PHQ - 2 Score   0 0  PHQ- 9 Score   0 0   Last fall risk screening   Row Labels 04/10/2022   10:27 AM  Fall Risk    Section Header. No data exists in this row.   Falls in the past year?   0  Number  falls in past yr:   0  Injury with Fall?   0  Risk for fall due to :   No Fall Risks  Follow up   Falls evaluation completed   Last Audit-C alcohol use screening   Row Labels 04/10/2022   10:32 AM  Alcohol Use Disorder Test (AUDIT)   Section Header. No data exists in this row.   1. How often do you have a drink containing alcohol?   1  2. How many drinks containing alcohol do you have on a typical day when you are drinking?   0  3. How often do you have six or more drinks on one occasion?   0  AUDIT-C Score   1   A score of 3 or more in women, and 4 or more in men indicates increased risk for alcohol abuse, EXCEPT if all of the points are from question 1    Assessment & Plan    1. Encounter for general adult medical examination with abnormal findings Annual physical today   2. Acquired hypothyroidism Continue  regular visits with endocrinology as  scheduled   3. Dyslipidemia, goal LDL below 100 Recommend patient limit intake of fried and fatty foods. She should increase intake of lean proteins and green leafy vegetables. Adding exercise into daily routine will also be beneficial.    4. Need for influenza vaccination Flu vaccine administered during today's visit.  - Flu Vaccine QUAD 6+ mos PF IM (Fluarix Quad PF)    Immunization History  Administered Date(s) Administered   Influenza,inj,Quad PF,6+ Mos 10/25/2022   Influenza-Unspecified 08/06/2017, 09/10/2018   PFIZER(Purple Top)SARS-COV-2 Vaccination 01/21/2020, 02/09/2020   Tdap 12/19/2021   Unspecified SARS-COV-2 Vaccination 02/25/2020, 03/17/2020, 10/27/2020   Zoster Recombinat (Shingrix) 08/06/2017, 11/06/2017    Health Maintenance  Topic Date Due   HIV Screening  Never done   PAP SMEAR-Modifier  Never done   COVID-19 Vaccine (6 - 2023-24 season) 08/11/2022   MAMMOGRAM  02/14/2024   DTaP/Tdap/Td (2 - Td or Tdap) 12/20/2031   COLONOSCOPY (Pts 45-103yr Insurance coverage will need to be confirmed)  08/28/2032    INFLUENZA VACCINE  Completed   Hepatitis C Screening  Completed   Zoster Vaccines- Shingrix  Completed   HPV VACCINES  Aged Out    Discussed health benefits of physical activity, and encouraged her to engage in regular exercise appropriate for her age and condition.  Problem List Items Addressed This Visit       Endocrine   Acquired hypothyroidism     Other   Dyslipidemia, goal LDL below 100   Other Visit Diagnoses     Encounter for general adult medical examination with abnormal findings    -  Primary   Need for influenza vaccination       Relevant Orders   Flu Vaccine QUAD 6+ mos PF IM (Fluarix Quad PF) (Completed)        Return in about 1 year (  around 10/26/2023) for health maintenance exam - needs to set up lab appointment for 2 months for CMP.        Ronnell Freshwater, NP  [facility] at [facility] [phone]) [phone] (fax)  [place]

## 2022-11-08 DIAGNOSIS — Z6822 Body mass index (BMI) 22.0-22.9, adult: Secondary | ICD-10-CM | POA: Diagnosis not present

## 2022-11-08 DIAGNOSIS — N9089 Other specified noninflammatory disorders of vulva and perineum: Secondary | ICD-10-CM | POA: Diagnosis not present

## 2022-11-08 DIAGNOSIS — M21612 Bunion of left foot: Secondary | ICD-10-CM | POA: Diagnosis not present

## 2022-11-10 DIAGNOSIS — E785 Hyperlipidemia, unspecified: Secondary | ICD-10-CM | POA: Insufficient documentation

## 2022-12-06 DIAGNOSIS — Z4889 Encounter for other specified surgical aftercare: Secondary | ICD-10-CM | POA: Diagnosis not present

## 2022-12-14 ENCOUNTER — Other Ambulatory Visit: Payer: Self-pay

## 2022-12-14 DIAGNOSIS — Z130 Encounter for screening for diseases of the blood and blood-forming organs and certain disorders involving the immune mechanism: Secondary | ICD-10-CM

## 2022-12-14 DIAGNOSIS — Z0000 Encounter for general adult medical examination without abnormal findings: Secondary | ICD-10-CM

## 2022-12-25 ENCOUNTER — Other Ambulatory Visit: Payer: Self-pay | Admitting: Endocrinology

## 2022-12-25 ENCOUNTER — Other Ambulatory Visit: Payer: BC Managed Care – PPO

## 2022-12-25 DIAGNOSIS — Z13228 Encounter for screening for other metabolic disorders: Secondary | ICD-10-CM | POA: Diagnosis not present

## 2022-12-25 DIAGNOSIS — Z1329 Encounter for screening for other suspected endocrine disorder: Secondary | ICD-10-CM | POA: Diagnosis not present

## 2022-12-25 DIAGNOSIS — Z130 Encounter for screening for diseases of the blood and blood-forming organs and certain disorders involving the immune mechanism: Secondary | ICD-10-CM

## 2022-12-25 DIAGNOSIS — Z0000 Encounter for general adult medical examination without abnormal findings: Secondary | ICD-10-CM

## 2022-12-25 DIAGNOSIS — E038 Other specified hypothyroidism: Secondary | ICD-10-CM

## 2022-12-25 DIAGNOSIS — Z1321 Encounter for screening for nutritional disorder: Secondary | ICD-10-CM | POA: Diagnosis not present

## 2022-12-26 ENCOUNTER — Other Ambulatory Visit: Payer: Self-pay

## 2022-12-26 LAB — COMPREHENSIVE METABOLIC PANEL
ALT: 15 IU/L (ref 0–32)
AST: 21 IU/L (ref 0–40)
Albumin/Globulin Ratio: 2.3 — ABNORMAL HIGH (ref 1.2–2.2)
Albumin: 4.9 g/dL (ref 3.8–4.9)
Alkaline Phosphatase: 93 IU/L (ref 44–121)
BUN/Creatinine Ratio: 17 (ref 9–23)
BUN: 13 mg/dL (ref 6–24)
Bilirubin Total: 0.4 mg/dL (ref 0.0–1.2)
CO2: 24 mmol/L (ref 20–29)
Calcium: 9.8 mg/dL (ref 8.7–10.2)
Chloride: 100 mmol/L (ref 96–106)
Creatinine, Ser: 0.77 mg/dL (ref 0.57–1.00)
Globulin, Total: 2.1 g/dL (ref 1.5–4.5)
Glucose: 103 mg/dL — ABNORMAL HIGH (ref 70–99)
Potassium: 4.3 mmol/L (ref 3.5–5.2)
Sodium: 138 mmol/L (ref 134–144)
Total Protein: 7 g/dL (ref 6.0–8.5)
eGFR: 90 mL/min/{1.73_m2} (ref >59)

## 2022-12-26 LAB — T4, FREE: Free T4: 1.19 ng/dL (ref 0.82–1.77)

## 2022-12-26 LAB — TSH: TSH: 3.04 u[IU]/mL (ref 0.450–4.500)

## 2022-12-27 ENCOUNTER — Other Ambulatory Visit: Payer: Self-pay | Admitting: Obstetrics and Gynecology

## 2022-12-27 DIAGNOSIS — Z1231 Encounter for screening mammogram for malignant neoplasm of breast: Secondary | ICD-10-CM

## 2023-01-10 ENCOUNTER — Encounter: Payer: BC Managed Care – PPO | Admitting: Nurse Practitioner

## 2023-02-07 ENCOUNTER — Ambulatory Visit: Payer: BC Managed Care – PPO | Admitting: Cardiology

## 2023-02-14 ENCOUNTER — Other Ambulatory Visit: Payer: Self-pay | Admitting: Nurse Practitioner

## 2023-02-14 ENCOUNTER — Ambulatory Visit: Payer: BC Managed Care – PPO | Admitting: Cardiology

## 2023-02-14 ENCOUNTER — Encounter: Payer: Self-pay | Admitting: Cardiology

## 2023-02-14 VITALS — BP 124/78 | HR 88 | Ht 62.0 in | Wt 123.0 lb

## 2023-02-14 DIAGNOSIS — E7800 Pure hypercholesterolemia, unspecified: Secondary | ICD-10-CM

## 2023-02-14 DIAGNOSIS — Z8249 Family history of ischemic heart disease and other diseases of the circulatory system: Secondary | ICD-10-CM | POA: Diagnosis not present

## 2023-02-14 DIAGNOSIS — Z0189 Encounter for other specified special examinations: Secondary | ICD-10-CM | POA: Diagnosis not present

## 2023-02-14 DIAGNOSIS — Z79899 Other long term (current) drug therapy: Secondary | ICD-10-CM

## 2023-02-14 DIAGNOSIS — E039 Hypothyroidism, unspecified: Secondary | ICD-10-CM

## 2023-02-14 DIAGNOSIS — E785 Hyperlipidemia, unspecified: Secondary | ICD-10-CM

## 2023-02-14 MED ORDER — ATORVASTATIN CALCIUM 20 MG PO TABS: 20.0000 mg | ORAL_TABLET | Freq: Every day | ORAL | 2 refills | Status: DC

## 2023-02-14 NOTE — Progress Notes (Signed)
Primary Physician/Referring:  Ronnell Freshwater, NP  Patient ID: Tamara Adkins, female    DOB: 1966/05/21, 57 y.o.   MRN: [id number]  Chief Complaint  Patient presents with   Family history of MI (myocardial infarction)   Hyperlipidemia   Follow-up   HPI:    Tamara Adkins  is a 57 y.o. Caucasian female with no significant cardiovascular risk factors except for hyperlipidemia, no immediate family members with known coronary disease or sudden cardiac death but has multiple relatives with cardiac events.  Seen her 2 years ago, I recommended coronary calcium score which is 0 and recommended continued primary prevention although her LDL was mildly elevated.  She now presents for follow-up.  Remains asymptomatic.   Except for elevated lipids, she has no risk factors, however because of multiple relatives having had coronary events, she is concerned and continues to remain very vigilant about her health issues.  She continues to exercise at least 2 days a week by brisk walking and Zumba and line dancing discharge at least once a week also continues remain active at home and around the house and has no symptoms.  Past Medical History:  Diagnosis Date   Fibroids    Hypothyroidism    PONV (postoperative nausea and vomiting)    Thyroid disease    Past Surgical History:  Procedure Laterality Date   AUGMENTATION MAMMAPLASTY Bilateral    BUNIONECTOMY Right 06/01/2022   Procedure: Right 1st metatarsal scarf osteotomy and proximal phalanx akin osteotomy;  Surgeon: Wylene Simmer, MD;  Location: [facility];  Service: Orthopedics;  Laterality: Right;   METATARSAL OSTEOTOMY WITH BUNIONECTOMY Left 09/28/2022   Procedure: Barbie Banner AND SCARF  OSTEOTOMY BUNION CORRECTION;  Surgeon: Wylene Simmer, MD;  Location: [facility];  Service: Orthopedics;  Laterality: Left;   Family History  Problem Relation Age of Onset   Hyperlipidemia Mother    Hypertension Mother    Hyperlipidemia  Father    Hypertension Father    Stroke Father    Other Brother 30       Car Accident   Heart attack Maternal Grandmother    Thyroid disease Maternal Grandmother        hypothroid   Heart attack Paternal Grandmother    Breast cancer Neg Hx     Social History   Tobacco Use   Smoking status: Never   Smokeless tobacco: Never  Substance Use Topics   Alcohol use: Not Currently   Marital Status: Married  ROS  Review of Systems  Cardiovascular:  Negative for chest pain, dyspnea on exertion and leg swelling.  Gastrointestinal:  Negative for melena.   Objective  Blood pressure 124/78, pulse 88, height '5\' 2"'$  (1.575 m), weight 123 lb (55.8 kg), last menstrual period 04/18/2018, SpO2 100 %. Body mass index is 22.5 kg/m.     02/14/2023    2:54 PM 10/25/2022    2:32 PM 09/28/2022   12:04 PM  Vitals with BMI  Height '5\' 2"'$  '5\' 2"'$    Weight 123 lbs 117 lbs   BMI [id number]   Systolic [id number]  Diastolic 78 75 87  Pulse 88 90 96     Physical Exam  Neck: No JVD present.  Cardiovascular: Regular rhythm, normal heart sounds, intact distal pulses and normal pulses. Exam reveals no gallop.  No murmur heard. Pulmonary/Chest: Effort normal and breath sounds normal.  Abdominal: Soft. Bowel sounds are normal.  Musculoskeletal:        General:  No edema.     Laboratory examination:      Latest Ref Rng & Units 12/25/2022    8:57 AM 10/09/2022    8:34 AM  CMP  Glucose 70 - 99 mg/dL 103  93   BUN 6 - 24 mg/dL 13  14   Creatinine 0.57 - 1.00 mg/dL 0.77  0.80   Sodium 134 - 144 mmol/L 138  138   Potassium 3.5 - 5.2 mmol/L 4.3  4.1   Chloride 96 - 106 mmol/L 100  99   CO2 20 - 29 mmol/L 24  25   Calcium 8.7 - 10.2 mg/dL 9.8  9.7   Total Protein 6.0 - 8.5 g/dL 7.0  7.0   Total Bilirubin 0.0 - 1.2 mg/dL 0.4  0.5   Alkaline Phos 44 - 121 IU/L 93  126   AST 0 - 40 IU/L 21  37   ALT 0 - 32 IU/L 15  42       Latest Ref Rng & Units 10/09/2022    8:34 AM  CBC  WBC 3.4 - 10.8 x10E3/uL  6.1   Hemoglobin 11.1 - 15.9 g/dL 12.5   Hematocrit 34.0 - 46.6 % 36.9   Platelets 150 - 450 x10E3/uL 255    Lipid Panel     Component Value Date/Time   CHOL 253 (H) 10/09/2022 0834   TRIG 107 10/09/2022 0834   HDL 69 10/09/2022 0834   CHOLHDL 3.7 10/09/2022 0834   LDLCALC 165 (H) 10/09/2022 0834   HEMOGLOBIN A1C Lab Results  Component Value Date   HGBA1C 5.4 10/09/2022   TSH Recent Labs    12/25/22 0905  TSH 3.040    External labs:   Labs 04/05/2021:  Total cholesterol 229, triglycerides 79, HDL 70, LDL 141.  Non-HDL cholesterol 159.  BUN 16, creatinine 0.90, EGFR 72 mL, potassium 4.2, CMP otherwise normal.  Hb 12.5/HCT 37.5, platelets 255.  Normal indicis.  A1c 5.3%.  Medications and allergies   Allergies  Allergen Reactions   Hydrocodone Itching   Wound Dressing Adhesive Rash    Bandaids, tape     Current Outpatient Medications:    atorvastatin (LIPITOR) 20 MG tablet, Take 1 tablet (20 mg total) by mouth daily., Disp: 30 tablet, Rfl: 2   levothyroxine (SYNTHROID) 25 MCG tablet, TAKE 1 &1/2 TABLETS DAILY BEFORE BREAKFAST, Disp: 135 tablet, Rfl: 1   Radiology:   No results found.  Cardiac Studies:   Coronary calcium score 04/27/2021: 1. Coronary calcium score is 0. 2. 3 mm peripheral nodule in the left upper lobe is indeterminate. No follow-up needed if patient is low-risk.  EKG:   EKG 02/14/2023: Normal sinus rhythm at the rate of 90 bpm, normal axis, no evidence of ischemia, normal EKG. no significant change from 04/11/2021.   Assessment     ICD-10-CM   1. Hypercholesteremia  E78.00 atorvastatin (LIPITOR) 20 MG tablet    2. Family history of MI (myocardial infarction)  Z82.49 EKG 12-Lead       Medications Discontinued During This Encounter  Medication Reason   docusate sodium (COLACE) 100 MG capsule Patient Preference    Meds ordered this encounter  Medications   atorvastatin (LIPITOR) 20 MG tablet    Sig: Take 1 tablet (20 mg total) by  mouth daily.    Dispense:  30 tablet    Refill:  2   Orders Placed This Encounter  Procedures   EKG 12-Lead   Recommendations:   Tamara Adkins is a  57 y.o. Caucasian female with no significant cardiovascular risk factors except for hyperlipidemia, no immediate family members with known coronary disease or sudden cardiac death but has multiple relatives with cardiac events.  Seen her 2 years ago, I recommended coronary calcium score which is 0 and recommended continued primary prevention although her LDL was mildly elevated.  She now presents for follow-up.  Remains asymptomatic.   1. Hypercholesteremia Physical examination is unremarkable.  I reviewed her external labs, LDL has risen to >160.  In view of this, she had addition was made to start statin therapy, Lipitor 20 mg will be added, goal LDL <100 in the absence of any other risk factors.  If LDL goal is not achieved, could consider either doubling up on Lipitor or addition of Zetia 10 mg daily, patient will be followed by her PCP Ms. Leretha Pol, I have requested her to repeat her labs in 6 weeks for management of hypercholesterolemia.  2. Family history of MI (myocardial infarction) This is her only risk factor, she appears well, continues to be physically active, ideal body weight, non-smoker, nondiabetic and nonhypertensive.  I will see her back on a as needed basis.  I spent 25 minutes discussing her risk factors, initiation of medications, coordination of care.    Adrian Prows, MD, [facility] 02/14/2023, 10:32 PM Office: [phone]

## 2023-02-19 ENCOUNTER — Ambulatory Visit: Payer: BC Managed Care – PPO

## 2023-03-12 ENCOUNTER — Other Ambulatory Visit: Payer: Self-pay | Admitting: Endocrinology

## 2023-03-28 ENCOUNTER — Ambulatory Visit
Admission: RE | Admit: 2023-03-28 | Discharge: 2023-03-28 | Disposition: A | Discharge status: Home / Self Care | Payer: BC Managed Care – PPO | Source: Ambulatory Visit | Attending: Obstetrics and Gynecology | Admitting: Obstetrics and Gynecology

## 2023-03-28 DIAGNOSIS — Z1231 Encounter for screening mammogram for malignant neoplasm of breast: Secondary | ICD-10-CM

## 2023-04-02 ENCOUNTER — Other Ambulatory Visit: Payer: BC Managed Care – PPO

## 2023-04-02 DIAGNOSIS — E039 Hypothyroidism, unspecified: Secondary | ICD-10-CM

## 2023-04-02 DIAGNOSIS — Z79899 Other long term (current) drug therapy: Secondary | ICD-10-CM | POA: Diagnosis not present

## 2023-04-02 DIAGNOSIS — E785 Hyperlipidemia, unspecified: Secondary | ICD-10-CM

## 2023-04-03 LAB — CBC
Hematocrit: 38 % (ref 34.0–46.6)
Hemoglobin: 13 g/dL (ref 11.1–15.9)
MCH: 29.7 pg (ref 26.6–33.0)
MCHC: 34.2 g/dL (ref 31.5–35.7)
MCV: 87 fL (ref 79–97)
Platelets: 247 10*3/uL (ref 150–450)
RBC: 4.38 x10E6/uL (ref 3.77–5.28)
RDW: 12.7 % (ref 11.7–15.4)
WBC: 5.9 10*3/uL (ref 3.4–10.8)

## 2023-04-03 LAB — COMPREHENSIVE METABOLIC PANEL
ALT: 22 IU/L (ref 0–32)
AST: 26 IU/L (ref 0–40)
Albumin/Globulin Ratio: 1.9 (ref 1.2–2.2)
Albumin: 4.8 g/dL (ref 3.8–4.9)
Alkaline Phosphatase: 118 IU/L (ref 44–121)
BUN/Creatinine Ratio: 17 (ref 9–23)
BUN: 15 mg/dL (ref 6–24)
Bilirubin Total: 0.7 mg/dL (ref 0.0–1.2)
CO2: 22 mmol/L (ref 20–29)
Calcium: 10.1 mg/dL (ref 8.7–10.2)
Chloride: 100 mmol/L (ref 96–106)
Creatinine, Ser: 0.87 mg/dL (ref 0.57–1.00)
Globulin, Total: 2.5 g/dL (ref 1.5–4.5)
Glucose: 94 mg/dL (ref 70–99)
Potassium: 4.2 mmol/L (ref 3.5–5.2)
Sodium: 138 mmol/L (ref 134–144)
Total Protein: 7.3 g/dL (ref 6.0–8.5)
eGFR: 78 mL/min/{1.73_m2} (ref >59)

## 2023-04-03 LAB — LIPID PANEL
Chol/HDL Ratio: 2.7 ratio (ref 0.0–4.4)
Cholesterol, Total: 179 mg/dL (ref 100–199)
HDL: 67 mg/dL (ref >39)
LDL Chol Calc (NIH): 95 mg/dL (ref 0–99)
Triglycerides: 94 mg/dL (ref 0–149)
VLDL Cholesterol Cal: 17 mg/dL (ref 5–40)

## 2023-04-03 LAB — TSH+FREE T4
Free T4: 1.18 ng/dL (ref 0.82–1.77)
TSH: 2.35 u[IU]/mL (ref 0.450–4.500)

## 2023-05-10 ENCOUNTER — Other Ambulatory Visit: Payer: Self-pay | Admitting: Cardiology

## 2023-05-10 DIAGNOSIS — E7800 Pure hypercholesterolemia, unspecified: Secondary | ICD-10-CM

## 2023-07-10 ENCOUNTER — Other Ambulatory Visit: Payer: Self-pay | Admitting: Endocrinology

## 2023-07-10 DIAGNOSIS — E038 Other specified hypothyroidism: Secondary | ICD-10-CM

## 2023-07-11 ENCOUNTER — Other Ambulatory Visit: Payer: BC Managed Care – PPO

## 2023-07-11 DIAGNOSIS — H40013 Open angle with borderline findings, low risk, bilateral: Secondary | ICD-10-CM | POA: Diagnosis not present

## 2023-07-11 DIAGNOSIS — H4423 Degenerative myopia, bilateral: Secondary | ICD-10-CM | POA: Diagnosis not present

## 2023-07-11 DIAGNOSIS — H2513 Age-related nuclear cataract, bilateral: Secondary | ICD-10-CM | POA: Diagnosis not present

## 2023-07-11 DIAGNOSIS — H04123 Dry eye syndrome of bilateral lacrimal glands: Secondary | ICD-10-CM | POA: Diagnosis not present

## 2023-07-16 ENCOUNTER — Other Ambulatory Visit (INDEPENDENT_AMBULATORY_CARE_PROVIDER_SITE_OTHER): Payer: BC Managed Care – PPO

## 2023-07-16 DIAGNOSIS — E038 Other specified hypothyroidism: Secondary | ICD-10-CM

## 2023-07-16 LAB — TSH: TSH: 0.95 u[IU]/mL (ref 0.35–5.50)

## 2023-07-16 LAB — T4, FREE: Free T4: 0.87 ng/dL (ref 0.60–1.60)

## 2023-07-18 ENCOUNTER — Ambulatory Visit: Payer: BC Managed Care – PPO | Admitting: Endocrinology

## 2023-07-18 ENCOUNTER — Encounter: Payer: Self-pay | Admitting: Endocrinology

## 2023-07-18 VITALS — BP 112/72 | HR 77 | Ht 62.0 in | Wt 117.8 lb

## 2023-07-18 DIAGNOSIS — E038 Other specified hypothyroidism: Secondary | ICD-10-CM

## 2023-07-18 NOTE — Progress Notes (Signed)
Patient ID: Tamara Adkins, female   DOB: [birth year], 57 y.o.   MRN: [id number]                                                                                                                                      Reason for Appointment: Follow-up of thyroid                                                                                               Chief complaint: Follow-up   History of Present Illness:   Prior history: Reportedly in 2004 the patient was diagnosed to have hyperthyroidism At that time she was having problems with frequent hives that continued for several months Also at times she would be feeling some palpitations She does not think she remembers any symptoms of unusual weight loss, sweating or heat intolerance  After her diagnosis of hyperthyroidism she was treated with PTU, initially higher doses and then subsequently this was reduced to apparently 50 mg twice daily by her endocrinologist No records are available from previous management She was told to continue the medication and was subjectively feeling quite well Her last evaluation with the endocrinologist was in September 2018 and no changes were made  In April 2019 patient started having joint pains in multiple joints Also she thinks she may have been starting to feel more tired Did not feel that she had any significant weight change, heat or cold intolerance at that time Also over the last 6 months has had a tendency to hair loss  She was evaluated by her PCP in June and her TSH was high she was told by her PCP to stop her PTU, TSH 11.6 and 2 days later was 6.5 On 06/19/2018 TSH was 5.17 with normal free T4  RECENT history:  Because of her complaints of fatigue and arthralgia along with a low free T4 level of 0.54 in 10/2018 she was started on levothyroxine 25 mcg daily Although she was recommended 50 mcg subsequently she felt some palpitations with this dosage With levothyroxine she has had improvement  in her fatigue  She has continued to take 37.5 mcg levothyroxine since about 11/2018  The dose has been continued unchanged on her last visit  Since her last visit she still has good control of joint pains or fatigue No lethargy or cold intolerance No skin or hair changes  She is taking her levothyroxine regularly every morning before breakfast Has not missed any doses She takes her calcium supplements in the evening  Her free T4 is still normal although around 0.9 compared to 1.2 previously   Thyroid results:  Lab Results  Component Value Date   FREET4 0.87 07/16/2023   FREET4 1.18 04/02/2023   FREET4 1.19 12/25/2022   T3FREE 3.8 02/24/2019   T3FREE 3.2 11/25/2018   TSH 0.95 07/16/2023   TSH 2.350 04/02/2023   TSH 3.040 12/25/2022      No results found for: "THYROTRECAB"   Allergies as of 07/18/2023       Reactions   Hydrocodone Itching   Wound Dressing Adhesive Rash   Bandaids, tape        Medication List        Accurate as of July 18, 2023 10:01 AM. If you have any questions, ask your nurse or doctor.          atorvastatin 20 MG tablet Commonly known as: LIPITOR TAKE 1 TABLET BY MOUTH EVERY DAY   CALCIUM 600 + D PO   levothyroxine 25 MCG tablet Commonly known as: SYNTHROID TAKE 1 &1/2 TABLETS DAILY BEFORE BREAKFAST   Vitamin D3 50 MCG (2000 UT) capsule 5000 U            Past Medical History:  Diagnosis Date   Fibroids    Hypothyroidism    PONV (postoperative nausea and vomiting)    Thyroid disease     Past Surgical History:  Procedure Laterality Date   AUGMENTATION MAMMAPLASTY Bilateral    BUNIONECTOMY Right 06/01/2022   Procedure: Right 1st metatarsal scarf osteotomy and proximal phalanx akin osteotomy;  Surgeon: Toni Arthurs, MD;  Location: [facility];  Service: Orthopedics;  Laterality: Right;   METATARSAL OSTEOTOMY WITH BUNIONECTOMY Left 09/28/2022   Procedure: Quintella Reichert AND SCARF  OSTEOTOMY BUNION CORRECTION;   Surgeon: Toni Arthurs, MD;  Location: [facility];  Service: Orthopedics;  Laterality: Left;    Family History  Problem Relation Age of Onset   Hyperlipidemia Mother    Hypertension Mother    Hyperlipidemia Father    Hypertension Father    Stroke Father    Other Brother 60       Car Accident   Heart attack Maternal Grandmother    Thyroid disease Maternal Grandmother        hypothroid   Heart attack Paternal Grandmother    Breast cancer Neg Hx     Social History:  reports that she has never smoked. She has never used smokeless tobacco. She reports that she does not currently use alcohol. She reports that she does not currently use drugs.  Allergies:  Allergies  Allergen Reactions   Hydrocodone Itching   Wound Dressing Adhesive Rash    Bandaids, tape     Review of Systems  Has periodic hot flashes and night sweats, now only mild and occasional  She was recommended a calcium and vitamin D supplement but has not taken any again She says her gynecologist may be going on density this year  She has mild hypercholesterolemia    Examination:   BP 112/72   Pulse 77   Ht 5\' 2"  (1.575 m)   Wt 117 lb 12.8 oz (53.4 kg)   LMP 04/18/2018 (Exact Date)   SpO2 95%   BMI 21.55 kg/m   Thyroid not enlarged Biceps reflexes show normal relaxation No tremor Her hands have normal temperature    Assessment/Plan:  Mild secondary hypothyroidism:  She has required a small dose of levothyroxine for supplementation, using 37.5 mcg since 10/2018 Did not complain  of any new fatigue or joint pains which had been her previous symptoms  Although free T4 is slightly lower than before it is still normal and within the range in the past  Since she is subjectively doing well she will continue the usual dose of 37.5 mcg every morning  Follow-up in 1 year  Reather Littler 07/18/2023, 10:01 AM    Note: This office note was prepared with Dragon voice recognition system technology.  Any transcriptional errors that result from this process are unintentional.     Reather Littler

## 2023-09-17 DIAGNOSIS — Z01419 Encounter for gynecological examination (general) (routine) without abnormal findings: Secondary | ICD-10-CM | POA: Diagnosis not present

## 2023-10-01 ENCOUNTER — Telehealth: Payer: Self-pay | Admitting: Endocrinology

## 2023-10-01 ENCOUNTER — Other Ambulatory Visit: Payer: Self-pay

## 2023-10-01 DIAGNOSIS — E038 Other specified hypothyroidism: Secondary | ICD-10-CM

## 2023-10-01 MED ORDER — LEVOTHYROXINE SODIUM 25 MCG PO TABS: ORAL_TABLET | ORAL | 1 refills | Status: DC

## 2023-10-01 NOTE — Telephone Encounter (Signed)
MEDICATION: Levothyroxine  PHARMACY:  [facility] on [address]  HAS THE PATIENT CONTACTED THEIR PHARMACY?  yes  IS THIS A 90 DAY SUPPLY : YES  IS PATIENT OUT OF MEDICATION: No  IF NOT; HOW MUCH IS LEFT: 2-3 left   LAST APPOINTMENT DATE: @8 /06/2023  NEXT APPOINTMENT DATE:@08 /18/2024  DO WE HAVE YOUR PERMISSION TO LEAVE A DETAILED MESSAGE?:  OTHER COMMENTS:    **Let patient know to contact pharmacy at the end of the day to make sure medication is ready. **  ** Please notify patient to allow 48-72 hours to process**  **Encourage patient to contact the pharmacy for refills or they can request refills through [facility]**

## 2023-10-01 NOTE — Telephone Encounter (Signed)
Levothyroxine refill request complete

## 2023-10-15 ENCOUNTER — Other Ambulatory Visit: Payer: Self-pay

## 2023-10-15 DIAGNOSIS — E785 Hyperlipidemia, unspecified: Secondary | ICD-10-CM

## 2023-10-15 DIAGNOSIS — Z0000 Encounter for general adult medical examination without abnormal findings: Secondary | ICD-10-CM

## 2023-10-24 ENCOUNTER — Other Ambulatory Visit: Payer: BC Managed Care – PPO

## 2023-10-24 DIAGNOSIS — Z0000 Encounter for general adult medical examination without abnormal findings: Secondary | ICD-10-CM

## 2023-10-24 DIAGNOSIS — E785 Hyperlipidemia, unspecified: Secondary | ICD-10-CM

## 2023-10-25 LAB — CBC WITH DIFFERENTIAL/PLATELET
Basophils Absolute: 0.1 10*3/uL (ref 0.0–0.2)
Basos: 1 %
EOS (ABSOLUTE): 0.2 10*3/uL (ref 0.0–0.4)
Eos: 3 %
Hematocrit: 35.7 % (ref 34.0–46.6)
Hemoglobin: 12.1 g/dL (ref 11.1–15.9)
Immature Grans (Abs): 0 10*3/uL (ref 0.0–0.1)
Immature Granulocytes: 0 %
Lymphocytes Absolute: 2.2 10*3/uL (ref 0.7–3.1)
Lymphs: 36 %
MCH: 29.9 pg (ref 26.6–33.0)
MCHC: 33.9 g/dL (ref 31.5–35.7)
MCV: 88 fL (ref 79–97)
Monocytes Absolute: 0.4 10*3/uL (ref 0.1–0.9)
Monocytes: 7 %
Neutrophils Absolute: 3.2 10*3/uL (ref 1.4–7.0)
Neutrophils: 53 %
Platelets: 236 10*3/uL (ref 150–450)
RBC: 4.05 x10E6/uL (ref 3.77–5.28)
RDW: 12.4 % (ref 11.7–15.4)
WBC: 6.1 10*3/uL (ref 3.4–10.8)

## 2023-10-25 LAB — COMPREHENSIVE METABOLIC PANEL
ALT: 31 [IU]/L (ref 0–32)
AST: 27 [IU]/L (ref 0–40)
Albumin: 4.7 g/dL (ref 3.8–4.9)
Alkaline Phosphatase: 96 [IU]/L (ref 44–121)
BUN/Creatinine Ratio: 20 (ref 9–23)
BUN: 15 mg/dL (ref 6–24)
Bilirubin Total: 0.6 mg/dL (ref 0.0–1.2)
CO2: 24 mmol/L (ref 20–29)
Calcium: 9.4 mg/dL (ref 8.7–10.2)
Chloride: 102 mmol/L (ref 96–106)
Creatinine, Ser: 0.76 mg/dL (ref 0.57–1.00)
Globulin, Total: 2.3 g/dL (ref 1.5–4.5)
Glucose: 96 mg/dL (ref 70–99)
Potassium: 4.2 mmol/L (ref 3.5–5.2)
Sodium: 139 mmol/L (ref 134–144)
Total Protein: 7 g/dL (ref 6.0–8.5)
eGFR: 91 mL/min/{1.73_m2} (ref >59)

## 2023-10-25 LAB — HEMOGLOBIN A1C
Est. average glucose Bld gHb Est-mCnc: 120 mg/dL
Hgb A1c MFr Bld: 5.8 % — ABNORMAL HIGH (ref 4.8–5.6)

## 2023-10-25 LAB — LIPID PANEL
Chol/HDL Ratio: 2.8 ratio (ref 0.0–4.4)
Cholesterol, Total: 177 mg/dL (ref 100–199)
HDL: 63 mg/dL (ref >39)
LDL Chol Calc (NIH): 99 mg/dL (ref 0–99)
Triglycerides: 83 mg/dL (ref 0–149)
VLDL Cholesterol Cal: 15 mg/dL (ref 5–40)

## 2023-10-29 ENCOUNTER — Encounter: Payer: BC Managed Care – PPO | Admitting: Nurse Practitioner

## 2023-10-29 ENCOUNTER — Encounter: Payer: Self-pay | Admitting: Family Medicine

## 2023-10-29 ENCOUNTER — Ambulatory Visit (INDEPENDENT_AMBULATORY_CARE_PROVIDER_SITE_OTHER): Payer: BC Managed Care – PPO | Admitting: Family Medicine

## 2023-10-29 VITALS — BP 114/74 | HR 85 | Ht 62.0 in | Wt 120.4 lb

## 2023-10-29 DIAGNOSIS — E038 Other specified hypothyroidism: Secondary | ICD-10-CM | POA: Diagnosis not present

## 2023-10-29 DIAGNOSIS — E785 Hyperlipidemia, unspecified: Secondary | ICD-10-CM

## 2023-10-29 MED ORDER — ATORVASTATIN CALCIUM 20 MG PO TABS: 20.0000 mg | ORAL_TABLET | Freq: Every day | ORAL | 3 refills | Status: DC

## 2023-10-29 NOTE — Progress Notes (Signed)
  Annual physical  Subjective   Patient ID: Tamara Adkins, female    DOB: 1966-05-16  Age: 57 y.o. MRN: [id number]  Chief Complaint  Patient presents with   Annual Exam   HPI Tamara Adkins is a 57 y.o. old female here  for annual exam.   Changes in his/her health in the last 12 months: no  Patient currently works as a Interior and spatial designer.  Is married, has 1 adult child and 1 grandchild.  Does not use tobacco.  Uses alcohol 2 to 3 days/week (wine).  Does not use recreational drug.  Tries to limit sugar in her diet but otherwise no dietary restrictions.  Patient exercises twice a week doing Zumba.  Also walks several days a week.  Patient gets Pap smears at her gynecologist office, Dr. Henderson Cloud.  The patient has the following chronic health issues that are monitored on a routine basis: hypothyroidism-Takes 1.5 tabs of Synthroid 25 mcg daily.  HLD-patient takes Lipitor 20 mg prescribed by her cardiologist Dr. Jacinto Halim.  Patient states cardiology told her she can get refills on her statin with [place].  HPI  Separate, acute concerns today: Patient complains of loose stools since starting her statin medication.  No increase in frequency or urgency of stool, just looser stools.  No diarrhea.  We discussed fiber supplementation.  The 10-year ASCVD risk score (Arnett DK, et al., 2019) is: 1.5%  Health Maintenance Due  Topic Date Due   HIV Screening  Never done   Cervical Cancer Screening (HPV/Pap Cotest)  Never done   COVID-19 Vaccine (6 - 2023-24 season) 08/12/2023      Objective:     BP 114/74   Pulse 85   Ht 5\' 2"  (1.575 m)   Wt 120 lb 6.4 oz (54.6 kg)   LMP 04/18/2018 (Exact Date)   SpO2 100%   BMI 22.02 kg/m    Physical Exam General: Alert, oriented HEENT: PERRLA, EOMI, moist oral mucosa CV: Regular rate and rhythm Pulmonary: Lungs clear to auscultation bilaterally no wheeze or crackles GI: Soft, nontender. MSK: Strength equal bilaterally Extremities: No pedal edema   No results found  for any visits on 10/29/23.      Assessment & Plan:   Hypothyroidism, secondary Assessment & Plan: Patient takes 37.5 mcg levothyroxine daily.  Most recent TSH normal.  Continue management per endocrinology.   Dyslipidemia, goal LDL below 100 Assessment & Plan: LDL less than 100 on most recent lab.  Complains of loose stools but no other side effects of her statin.  Continue atorvastatin 20 mg.  Refill sent today.  Metamucil as needed for loose stools.  Orders: -     Atorvastatin Calcium; Take 1 tablet (20 mg total) by mouth daily.  Dispense: 90 tablet; Refill: 3     Return in about 1 year (around 10/28/2024) for physical.    Sandre Kitty, MD

## 2023-10-29 NOTE — Assessment & Plan Note (Signed)
Patient takes 37.5 mcg levothyroxine daily.  Most recent TSH normal.  Continue management per endocrinology.

## 2023-10-29 NOTE — Assessment & Plan Note (Signed)
LDL less than 100 on most recent lab.  Complains of loose stools but no other side effects of her statin.  Continue atorvastatin 20 mg.  Refill sent today.  Metamucil as needed for loose stools.

## 2023-10-29 NOTE — Patient Instructions (Signed)
It was nice to see you today,  We addressed the following topics today: -I have sent in a refill of your statin medicine. - For loose stools, the easiest thing to do is make Metamucil fiber supplements.  You can start by taking it once a day and increasing up to 3 times a day as needed - Your A1c was in the prediabetic range.  Try to limit carbohydrate intake in the form of sugar, pasta, bread, rice.   Have a great day,  Frederic Jericho, MD

## 2024-01-02 DIAGNOSIS — H40013 Open angle with borderline findings, low risk, bilateral: Secondary | ICD-10-CM | POA: Diagnosis not present

## 2024-01-02 DIAGNOSIS — H00024 Hordeolum internum left upper eyelid: Secondary | ICD-10-CM | POA: Diagnosis not present

## 2024-01-02 DIAGNOSIS — H4423 Degenerative myopia, bilateral: Secondary | ICD-10-CM | POA: Diagnosis not present

## 2024-01-02 DIAGNOSIS — H2513 Age-related nuclear cataract, bilateral: Secondary | ICD-10-CM | POA: Diagnosis not present

## 2024-02-19 ENCOUNTER — Other Ambulatory Visit: Payer: Self-pay | Admitting: Obstetrics and Gynecology

## 2024-02-19 DIAGNOSIS — Z1231 Encounter for screening mammogram for malignant neoplasm of breast: Secondary | ICD-10-CM

## 2024-03-28 ENCOUNTER — Other Ambulatory Visit: Payer: Self-pay | Admitting: Endocrinology

## 2024-03-28 DIAGNOSIS — E038 Other specified hypothyroidism: Secondary | ICD-10-CM

## 2024-03-31 ENCOUNTER — Encounter: Payer: Self-pay | Admitting: Endocrinology

## 2024-03-31 ENCOUNTER — Ambulatory Visit
Admission: RE | Admit: 2024-03-31 | Discharge: 2024-03-31 | Disposition: A | Discharge status: Home / Self Care | Source: Ambulatory Visit | Attending: Obstetrics and Gynecology | Admitting: Obstetrics and Gynecology

## 2024-03-31 ENCOUNTER — Ambulatory Visit (INDEPENDENT_AMBULATORY_CARE_PROVIDER_SITE_OTHER): Admitting: Endocrinology

## 2024-03-31 VITALS — BP 128/80 | HR 87 | Resp 18 | Ht 62.0 in | Wt 122.4 lb

## 2024-03-31 DIAGNOSIS — E038 Other specified hypothyroidism: Secondary | ICD-10-CM

## 2024-03-31 DIAGNOSIS — Z1231 Encounter for screening mammogram for malignant neoplasm of breast: Secondary | ICD-10-CM

## 2024-03-31 DIAGNOSIS — E039 Hypothyroidism, unspecified: Secondary | ICD-10-CM

## 2024-03-31 LAB — TSH: TSH: 1.14 m[IU]/L (ref 0.40–4.50)

## 2024-03-31 LAB — T4, FREE: Free T4: 1.2 ng/dL (ref 0.8–1.8)

## 2024-03-31 NOTE — Progress Notes (Signed)
 Outpatient Endocrinology Note [place] Dilan Fullenwider, MD   Patient's Name: Tamara Adkins    DOB: 1966/11/06    MRN: [id number]  REASON OF VISIT: Follow-up for hypothyroidism  PCP: Laneta Pintos, MD  HISTORY OF PRESENT ILLNESS:   Tamara Adkins is a 58 y.o. old female with past medical history as listed below is presented for a follow up for hypothyroidism.   Pertinent Thyroid  History: Patient was previously seen by Dr. Hubert Madden and was last time seen in August 2024.  Patient was reportedly diagnosed with hypothyroidism in 2004, was treated with PTU, no records of prior treatment available to review, she was being managed including with endocrinologist in the outside facility prior to establishing care in this clinic.  In around 2019, when TSH was 11.6, PTU was stopped.  Later in November 2019 patient had low free T4 and was started on levothyroxine  25 mcg daily.  With the start of levothyroxine  she had improvement on fatigue.  -Question of mild secondary hypothyroidism ?  based on prior endocrinology office note.  Later on levothyroxine  was maintained on 37.5 mcg daily since December 2019.    Interval history Patient lately having symptoms of occasional palpitation and odd feeling in the throat and neck similar to when she had hyperthyroidism in the past.  She has been taking levothyroxine  37.5 mcg daily.  No change in bowel habit.  She has gained about 5 pounds of weight in last 6 months.  She takes levothyroxine  in the morning before breakfast.  And she reports compliance.  No other complaints today.  REVIEW OF SYSTEMS:  As per history of present illness.   PAST MEDICAL HISTORY: Past Medical History:  Diagnosis Date   Fibroids    Hypothyroidism    PONV (postoperative nausea and vomiting)    Thyroid  disease     PAST SURGICAL HISTORY: Past Surgical History:  Procedure Laterality Date   AUGMENTATION MAMMAPLASTY Bilateral    BUNIONECTOMY Right 06/01/2022   Procedure: Right 1st metatarsal  scarf osteotomy and proximal phalanx akin osteotomy;  Surgeon: Amada Backer, MD;  Location: [facility];  Service: Orthopedics;  Laterality: Right;   METATARSAL OSTEOTOMY WITH BUNIONECTOMY Left 09/28/2022   Procedure: Candida Chalk AND SCARF  OSTEOTOMY BUNION CORRECTION;  Surgeon: Amada Backer, MD;  Location: [facility];  Service: Orthopedics;  Laterality: Left;    ALLERGIES: Allergies  Allergen Reactions   Hydrocodone Itching   Wound Dressing Adhesive Rash    Bandaids, tape    FAMILY HISTORY:  Family History  Problem Relation Age of Onset   Hyperlipidemia Mother    Hypertension Mother    Hyperlipidemia Father    Hypertension Father    Stroke Father    Other Brother 39       Car Accident   Heart attack Maternal Grandmother    Thyroid  disease Maternal Grandmother        hypothroid   Heart attack Paternal Grandmother    Breast cancer Neg Hx     SOCIAL HISTORY: Social History   Socioeconomic History   Marital status: Married    Spouse name: Martina Sledge   Number of children: 1   Years of education: Not on file   Highest education level: Not on file  Occupational History   Occupation: Hairdresser  Tobacco Use   Smoking status: Never   Smokeless tobacco: Never  Vaping Use   Vaping status: Never Used  Substance and Sexual Activity   Alcohol use: Not Currently   Drug  use: Not Currently   Sexual activity: Yes    Partners: Male  Other Topics Concern   Not on file  Social History Narrative   Not on file   Social Drivers of Health   Financial Resource Strain: Low Risk  (04/10/2022)   Overall Financial Resource Strain (CARDIA)    Difficulty of Paying Living Expenses: Not very hard  Food Insecurity: No Food Insecurity (04/10/2022)   Hunger Vital Sign    Worried About Running Out of Food in the Last Year: Never true    Ran Out of Food in the Last Year: Never true  Transportation Needs: No Transportation Needs (04/10/2022)   PRAPARE - Doctor, general practice (Medical): No    Lack of Transportation (Non-Medical): No  Physical Activity: Sufficiently Active (04/10/2022)   Exercise Vital Sign    Days of Exercise per Week: 3 days    Minutes of Exercise per Session: 60 min  Stress: No Stress Concern Present (04/10/2022)   [employer] of Occupational Health - Occupational Stress Questionnaire    Feeling of Stress : Only a little  Social Connections: Socially Integrated (04/10/2022)   Social Connection and Isolation Panel [NHANES]    Frequency of Communication with Friends and Family: More than three times a week    Frequency of Social Gatherings with Friends and Family: More than three times a week    Attends Religious Services: More than 4 times per year    Active Member of [employer] or Organizations: Yes    Attends Banker Meetings: 1 to 4 times per year    Marital Status: Married    MEDICATIONS:  Current Outpatient Medications  Medication Sig Dispense Refill   atorvastatin  (LIPITOR) 20 MG tablet Take 1 tablet (20 mg total) by mouth daily. 90 tablet 3   Calcium  Carb-Cholecalciferol (CALCIUM  600 + D PO)      Cholecalciferol (VITAMIN D3) 50 MCG (2000 UT) capsule 5000 U     levothyroxine  (SYNTHROID ) 25 MCG tablet TAKE 1 &1/2 TABLETS DAILY BEFORE BREAKFAST 135 tablet 1   No current facility-administered medications for this visit.    PHYSICAL EXAM: Vitals:   03/31/24 1517  BP: 128/80  Pulse: 87  Resp: 18  SpO2: 97%  Weight: 122 lb 6.4 oz (55.5 kg)  Height: 5\' 2"  (1.575 m)   Body mass index is 22.39 kg/m.  Wt Readings from Last 3 Encounters:  03/31/24 122 lb 6.4 oz (55.5 kg)  10/29/23 120 lb 6.4 oz (54.6 kg)  07/18/23 117 lb 12.8 oz (53.4 kg)    General: Well developed, well nourished female in no apparent distress.  HEENT: AT/[place], no external lesions. Hearing intact to the spoken word Eyes: EOMI. No stare, proptosis or lid lag. Conjunctiva clear and no icterus. Neck: Trachea midline, neck supple without  appreciable thyromegaly or lymphadenopathy and no palpable thyroid  nodules Lungs: Clear to auscultation, no wheeze. Respirations not labored Heart: S1S2, Regular in rate and rhythm.  Abdomen: Soft, non tender Neurologic: Alert, oriented, normal speech, deep tendon biceps reflexes normal,  no gross focal neurological deficit Extremities: No pedal pitting edema, no tremors of outstretched hands Skin: Warm, color good. Psychiatric: Does not appear depressed or anxious  PERTINENT HISTORIC LABORATORY AND IMAGING STUDIES:  All pertinent laboratory results were reviewed. Please see HPI also for further details.   TSH  Date Value Ref Range Status  03/31/2024 1.14 0.40 - 4.50 mIU/L Final  07/16/2023 0.95 0.35 - 5.50 uIU/mL Final  04/02/2023  2.350 0.450 - 4.500 uIU/mL Final     ASSESSMENT / PLAN  1. Hypothyroidism, unspecified type    -Patient has hypothyroidism, has been on a stable dose of levothyroxine  37.5 mcg daily from November 2019.  She was hyperthyroid prior to that, diagnosed in 2004 was on PTU. - Lately complains of occasional palpitation and odd feeling in the throat.  She is clinically euthyroid today.  Plan: - I would like to check TSH, free T4 today and adjust dose of levothyroxine  if needed. - Annual endocrinology follow-up.    Latest Reference Range & Units 03/31/24 15:35  TSH 0.40 - 4.50 mIU/L 1.14  T4,Free(Direct) 0.8 - 1.8 ng/dL 1.2    Diagnoses and all orders for this visit:  Hypothyroidism, unspecified type -     T4, free -     TSH    DISPOSITION Follow up in clinic in 12 months suggested.  All questions answered and patient verbalized understanding of the plan.  [place] Bazil Dhanani, MD [facility] [address] [place], [address] [place], [place] [address] Phone # [phone]  At least part of this note was generated using voice recognition software. Inadvertent word errors may have occurred, which were not recognized during  the proofreading process.

## 2024-04-01 ENCOUNTER — Encounter: Payer: Self-pay | Admitting: Endocrinology

## 2024-04-01 MED ORDER — LEVOTHYROXINE SODIUM 25 MCG PO TABS: ORAL_TABLET | ORAL | 3 refills | Status: AC

## 2024-07-28 ENCOUNTER — Ambulatory Visit: Payer: BC Managed Care – PPO | Admitting: Endocrinology

## 2024-07-30 DIAGNOSIS — H4423 Degenerative myopia, bilateral: Secondary | ICD-10-CM | POA: Diagnosis not present

## 2024-07-30 DIAGNOSIS — H40013 Open angle with borderline findings, low risk, bilateral: Secondary | ICD-10-CM | POA: Diagnosis not present

## 2024-07-30 DIAGNOSIS — H33303 Unspecified retinal break, bilateral: Secondary | ICD-10-CM | POA: Diagnosis not present

## 2024-07-30 DIAGNOSIS — H2513 Age-related nuclear cataract, bilateral: Secondary | ICD-10-CM | POA: Diagnosis not present

## 2024-09-15 ENCOUNTER — Other Ambulatory Visit (HOSPITAL_BASED_OUTPATIENT_CLINIC_OR_DEPARTMENT_OTHER): Payer: Self-pay

## 2024-09-15 ENCOUNTER — Encounter (HOSPITAL_BASED_OUTPATIENT_CLINIC_OR_DEPARTMENT_OTHER): Payer: Self-pay

## 2024-09-15 ENCOUNTER — Ambulatory Visit (HOSPITAL_BASED_OUTPATIENT_CLINIC_OR_DEPARTMENT_OTHER)
Admission: EM | Admit: 2024-09-15 | Discharge: 2024-09-15 | Disposition: A | Discharge status: Home / Self Care | Attending: Family Medicine | Admitting: Family Medicine

## 2024-09-15 ENCOUNTER — Ambulatory Visit: Payer: Self-pay

## 2024-09-15 DIAGNOSIS — J0110 Acute frontal sinusitis, unspecified: Secondary | ICD-10-CM | POA: Diagnosis not present

## 2024-09-15 MED ORDER — PREDNISONE 20 MG PO TABS
40.0000 mg | ORAL_TABLET | Freq: Every day | ORAL | 0 refills | Status: AC
  Filled 2024-09-15: qty 10, 5d supply, fill #0

## 2024-09-15 MED ORDER — AMOXICILLIN 875 MG PO TABS
875.0000 mg | ORAL_TABLET | Freq: Two times a day (BID) | ORAL | 0 refills | Status: AC
  Filled 2024-09-15: qty 14, 7d supply, fill #0

## 2024-09-15 NOTE — Discharge Instructions (Signed)
 Treating you for a sinus infection.  Take the antibiotics and prednisone as prescribed. Continue with allergy medication as needed.  Follow-up as needed

## 2024-09-15 NOTE — Telephone Encounter (Signed)
 FYI Only or Action Required?: FYI only for provider.  Patient was last seen in primary care on 10/29/2023 by Chandra Toribio POUR, MD.  Called Nurse Triage reporting Facial Pain.  Symptoms began a week ago.  Interventions attempted: OTC medications: antihistimine.  Symptoms are: unchanged.  Triage Disposition: See Physician Within 24 Hours  Patient/caregiver understands and will follow disposition?: Yes         Copied from CRM [id number]. Topic: Clinical - Red Word Triage >> Sep 15, 2024  1:01 PM Rachelle R wrote: [facility] that prompted transfer to Nurse Triage: Patient states for about a week has had a sinus head and ear pain, feels like she is underwater. Reason for Disposition  Earache    Triager attempted to schedule with PCP, but no access. Offered at alternate [facility], declined. Scheduled with [facility] instead.  Answer Assessment - Initial Assessment Questions 1. LOCATION: Where does it hurt?      Sinus pain, ear pain 2. ONSET: When did the sinus pain start?  (e.g., hours, days)      A week 3. SEVERITY: How bad is the pain?   (Scale 0-10; or none, mild, moderate or severe)     *No Answer* 4. RECURRENT SYMPTOM: Have you ever had sinus problems before? If Yes, ask: When was the last time? and What happened that time?      denies 5. NASAL CONGESTION: Is the nose blocked? If Yes, ask: Can you open it or must you breathe through your mouth?     yes 6. NASAL DISCHARGE: Do you have discharge from your nose? If so ask, What color?     Yes, denies having color 7. FEVER: Do you have a fever? If Yes, ask: What is it, how was it measured, and when did it start?      denies 8. OTHER SYMPTOMS: Do you have any other symptoms? (e.g., sore throat, cough, earache, difficulty breathing)     Earache, itchy throat 9. PREGNANCY: Is there any chance you are pregnant? When was your last menstrual period?     N/a  Protocols used: Sinus Pain or  Congestion-A-AH

## 2024-09-15 NOTE — ED Triage Notes (Signed)
 Pt c/o nasal congestion, facial pain, HA, neck pain, bilateral ear fullness/pain for the last week. HA and facial pain are described as dull ache. Pt has been taking a daily allergy med with no relief.

## 2024-09-15 NOTE — ED Provider Notes (Signed)
 [facility]    CSN: [id number] Arrival date & time: 09/15/24  1503      History   Chief Complaint Chief Complaint  Patient presents with   Nasal Congestion    HPI Tamara Adkins is a 58 y.o. female.   Pt is a 58 year old female that presents with  c/o nasal congestion, facial pain, HA, neck pain, bilateral ear fullness/pain for the last week. HA and facial pain are described as dull ache. Pt has been taking a daily allergy med with no relief.       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   Hypothyroidism, secondary 03/03/2019   History of hyperthyroidism 08/10/2018    Past Surgical History:  Procedure Laterality Date   AUGMENTATION MAMMAPLASTY Bilateral    BUNIONECTOMY Right 06/01/2022   Procedure: Right 1st metatarsal scarf osteotomy and proximal phalanx akin osteotomy;  Surgeon: Kit Rush, MD;  Location: [facility];  Service: Orthopedics;  Laterality: Right;   METATARSAL OSTEOTOMY WITH BUNIONECTOMY Left 09/28/2022   Procedure: KATRINA AND SCARF  OSTEOTOMY BUNION CORRECTION;  Surgeon: Kit Rush, MD;  Location: [facility];  Service: Orthopedics;  Laterality: Left;    OB History   No obstetric history on file.      Home Medications    Prior to Admission medications   Medication Sig Start Date End Date Taking? Authorizing Provider  amoxicillin (AMOXIL) 875 MG tablet Take 1 tablet (875 mg total) by mouth 2 (two) times daily for 7 days. 09/15/24 09/22/24 Yes Jaque Dacy A, FNP   predniSONE (DELTASONE) 20 MG tablet Take 2 tablets (40 mg total) by mouth daily with breakfast for 5 days. 09/15/24 09/20/24 Yes Elbie Statzer A, FNP  atorvastatin  (LIPITOR) 20 MG tablet Take 1 tablet (20 mg total) by mouth daily. 10/29/23   Chandra Toribio POUR, MD  Calcium  Carb-Cholecalciferol (CALCIUM  600 + D PO)     [provider]  Cholecalciferol (VITAMIN D3) 50 MCG (2000 UT) capsule 5000 U    [provider]  levothyroxine  (SYNTHROID ) 25 MCG tablet TAKE 1 &1/2 TABLETS DAILY BEFORE BREAKFAST 04/01/24   Thapa, [place], MD    Family History Family History  Problem Relation Age of Onset   Hyperlipidemia Mother    Hypertension Mother    Hyperlipidemia Father    Hypertension Father    Stroke Father    Other Brother 67       Car Accident   Heart attack Maternal Grandmother    Thyroid  disease Maternal Grandmother        hypothroid   Heart attack Paternal Grandmother    Breast cancer Neg Hx     Social History Social History   Tobacco Use   Smoking status: Never   Smokeless tobacco: Never  Vaping Use   Vaping status: Never Used  Substance Use Topics   Alcohol use: Not Currently   Drug use: Not Currently     Allergies   Hydrocodone and Wound dressing adhesive   Review of Systems Review  of Systems  See HPI Physical Exam Triage Vital Signs ED Triage Vitals  Encounter Vitals Group     BP 09/15/24 1519 (!) 143/93     Girls Systolic BP Percentile --      Girls Diastolic BP Percentile --      Boys Systolic BP Percentile --      Boys Diastolic BP Percentile --      Pulse Rate 09/15/24 1519 83     Resp 09/15/24 1519 20     Temp 09/15/24 1519 98.1 F (36.7 C)     Temp Source 09/15/24 1519 Oral     SpO2 09/15/24 1519 96 %     Weight --      Height --      Head Circumference --      Peak Flow --      Pain Score 09/15/24 1517 4     Pain Loc --      Pain Education --      Exclude from Growth Chart --    No data found.  Updated Vital Signs BP (!) 143/93  (BP Location: Right Arm)   Pulse 83   Temp 98.1 F (36.7 C) (Oral)   Resp 20   LMP 04/18/2018 (Exact Date)   SpO2 96%   Visual Acuity Right Eye Distance:   Left Eye Distance:   Bilateral Distance:    Right Eye Near:   Left Eye Near:    Bilateral Near:     Physical Exam Constitutional:      General: She is not in acute distress.    Appearance: Normal appearance. She is not ill-appearing, toxic-appearing or diaphoretic.  HENT:     Head: Normocephalic and atraumatic.     Right Ear: Tympanic membrane and ear canal normal.     Left Ear: Tympanic membrane and ear canal normal.     Nose: Congestion and rhinorrhea present.     Mouth/Throat:     Pharynx: Oropharynx is clear.  Eyes:     Conjunctiva/sclera: Conjunctivae normal.  Cardiovascular:     Rate and Rhythm: Normal rate and regular rhythm.     Pulses: Normal pulses.     Heart sounds: Normal heart sounds.  Pulmonary:     Effort: Pulmonary effort is normal.     Breath sounds: Normal breath sounds.  Skin:    General: Skin is warm and dry.  Neurological:     Mental Status: She is alert.  Psychiatric:        Mood and Affect: Mood normal.      UC Treatments / Results  Labs (all labs ordered are listed, but only abnormal results are displayed) Labs Reviewed - No data to display  EKG   Radiology No results found.  Procedures Procedures (including critical care time)  Medications Ordered in UC Medications - No data to display  Initial Impression / Assessment and Plan / UC Course  I have reviewed the triage vital signs and the nursing notes.  Pertinent labs & imaging results that were available during my care of the patient were reviewed by me and considered in my medical decision making (see chart for details).     Acute sinusitis- treating with Amoxicillin and prednisone. OTC meds as needed. Follow up as needed.  Final Clinical Impressions(s) / UC Diagnoses   Final diagnoses:  Acute non-recurrent frontal  sinusitis     Discharge Instructions      Treating you for a sinus infection.  Take the antibiotics and  prednisone as prescribed. Continue with allergy medication as needed.  Follow-up as needed    ED Prescriptions     Medication Sig Dispense Auth. Provider   amoxicillin (AMOXIL) 875 MG tablet Take 1 tablet (875 mg total) by mouth 2 (two) times daily for 7 days. 14 tablet Hilde Churchman A, FNP   predniSONE (DELTASONE) 20 MG tablet Take 2 tablets (40 mg total) by mouth daily with breakfast for 5 days. 10 tablet Adah Wilbert LABOR, FNP      PDMP not reviewed this encounter.   Adah Wilbert LABOR, FNP 09/15/24 1902

## 2024-09-25 ENCOUNTER — Other Ambulatory Visit (HOSPITAL_BASED_OUTPATIENT_CLINIC_OR_DEPARTMENT_OTHER): Payer: Self-pay | Admitting: Obstetrics and Gynecology

## 2024-09-25 DIAGNOSIS — E2839 Other primary ovarian failure: Secondary | ICD-10-CM

## 2024-10-27 ENCOUNTER — Other Ambulatory Visit: Payer: BC Managed Care – PPO

## 2024-10-27 ENCOUNTER — Other Ambulatory Visit: Payer: Self-pay | Admitting: Family Medicine

## 2024-10-27 DIAGNOSIS — E039 Hypothyroidism, unspecified: Secondary | ICD-10-CM

## 2024-10-27 DIAGNOSIS — R7303 Prediabetes: Secondary | ICD-10-CM

## 2024-10-27 DIAGNOSIS — E785 Hyperlipidemia, unspecified: Secondary | ICD-10-CM

## 2024-10-29 ENCOUNTER — Other Ambulatory Visit

## 2024-10-29 DIAGNOSIS — E039 Hypothyroidism, unspecified: Secondary | ICD-10-CM

## 2024-10-29 DIAGNOSIS — E785 Hyperlipidemia, unspecified: Secondary | ICD-10-CM

## 2024-10-29 DIAGNOSIS — R7303 Prediabetes: Secondary | ICD-10-CM

## 2024-10-30 ENCOUNTER — Ambulatory Visit: Payer: Self-pay | Admitting: Family Medicine

## 2024-10-30 LAB — COMPREHENSIVE METABOLIC PANEL WITH GFR
ALT: 29 IU/L (ref 0–32)
AST: 30 IU/L (ref 0–40)
Albumin: 4.7 g/dL (ref 3.8–4.9)
Alkaline Phosphatase: 91 IU/L (ref 49–135)
BUN/Creatinine Ratio: 18 (ref 9–23)
BUN: 14 mg/dL (ref 6–24)
Bilirubin Total: 0.6 mg/dL (ref 0.0–1.2)
CO2: 24 mmol/L (ref 20–29)
Calcium: 9.4 mg/dL (ref 8.7–10.2)
Chloride: 102 mmol/L (ref 96–106)
Creatinine, Ser: 0.76 mg/dL (ref 0.57–1.00)
Globulin, Total: 2.2 g/dL (ref 1.5–4.5)
Glucose: 93 mg/dL (ref 70–99)
Potassium: 4.4 mmol/L (ref 3.5–5.2)
Sodium: 139 mmol/L (ref 134–144)
Total Protein: 6.9 g/dL (ref 6.0–8.5)
eGFR: 91 mL/min/1.73 (ref >59)

## 2024-10-30 LAB — LIPID PANEL
Chol/HDL Ratio: 2.6 ratio (ref 0.0–4.4)
Cholesterol, Total: 173 mg/dL (ref 100–199)
HDL: 66 mg/dL (ref >39)
LDL Chol Calc (NIH): 92 mg/dL (ref 0–99)
Triglycerides: 80 mg/dL (ref 0–149)
VLDL Cholesterol Cal: 15 mg/dL (ref 5–40)

## 2024-10-30 LAB — TSH: TSH: 1.03 u[IU]/mL (ref 0.450–4.500)

## 2024-10-30 LAB — HEMOGLOBIN A1C
Est. average glucose Bld gHb Est-mCnc: 103 mg/dL
Hgb A1c MFr Bld: 5.2 % (ref 4.8–5.6)

## 2024-11-03 ENCOUNTER — Encounter: Payer: Self-pay | Admitting: Family Medicine

## 2024-11-03 ENCOUNTER — Ambulatory Visit (INDEPENDENT_AMBULATORY_CARE_PROVIDER_SITE_OTHER): Payer: BC Managed Care – PPO | Admitting: Family Medicine

## 2024-11-03 VITALS — BP 103/69 | HR 77 | Ht 62.0 in | Wt 120.0 lb

## 2024-11-03 DIAGNOSIS — E785 Hyperlipidemia, unspecified: Secondary | ICD-10-CM | POA: Diagnosis not present

## 2024-11-03 DIAGNOSIS — Z23 Encounter for immunization: Secondary | ICD-10-CM

## 2024-11-03 DIAGNOSIS — R7303 Prediabetes: Secondary | ICD-10-CM | POA: Diagnosis not present

## 2024-11-03 DIAGNOSIS — E038 Other specified hypothyroidism: Secondary | ICD-10-CM | POA: Diagnosis not present

## 2024-11-03 DIAGNOSIS — Z0000 Encounter for general adult medical examination without abnormal findings: Secondary | ICD-10-CM | POA: Diagnosis not present

## 2024-11-03 NOTE — Patient Instructions (Addendum)
 It was nice to see you today,  We addressed the following topics today: - you can call this number to schedule the bone density scan and ask if they can do it at [facility].  I will send in a new order to [facility] just in case: [phone]  - If you find out your prescriptions are due for a refill when you go to the pharmacy, they will message us  and we will send in the new prescription. - You can schedule your physical exam for next year at the front desk. Please schedule labs to be done before that appointment.  Have a great day,  Rolan Slain, MD

## 2024-11-03 NOTE — Progress Notes (Unsigned)
   Annual physical  Subjective    Patient ID: Tamara Adkins, female    DOB: 1966/03/10  Age: 58 y.o. MRN: [id number]  Chief Complaint  Patient presents with   Annual Exam   HPI Tamara Adkins is a 58 y.o. old female here  for annual exam.   Subjective - Annual physical exam. Reports no new issues or concerns since last visit one year ago.  Medications Current medications include Synthroid  one and a half tablets, over-the-counter vitamin D , over-the-counter calcium , and Lipitor. Recently added a women's over 50s multivitamin. No refills needed at this time.  PMH, PSH, FH, Social Hx PMHx: Hypothyroidism, hyperlipidemia. Last Pap smear was normal. Last mammogram was normal. Last colonoscopy was normal, next one due in 7 years. Social Hx: Works standing all day. Reports being very busy.  ROS No new complaints. Denies any new symptoms or changes since last visit.  Objective PULM: Lungs clear to auscultation bilaterally.  Assessment and Plan Health Maintenance - Follow-up for annual physical. No new concerns reported. All labs from last visit were normal. Continues current medication regimen. - Continue current medications. - Continue current exercise routine. - Schedule next year's physical with labs. - If refills are needed before next appointment, pharmacy will send a request.  Bone Density Scan - Patient was referred for a bone density test by her gynecologist about a month ago, but was never contacted to schedule the appointment. The order was sent to [facility]. - Advised to check with front desk staff (April) to see if they can determine where the order was sent and who to call. - If needed, the order can be switched to a closer imaging center like the one at [facility] on [address]. - If the order cannot be located or switched, a new order will be placed from this office.  Immunizations - Patient is due for a pneumococcal vaccine as per new recommendations for age 44+. Has  never had one before. - Administered Prevnar 20 today. Counseled this is a one-time dose. - Reports receiving flu vaccine about a month ago.  The 10-year ASCVD risk score (Arnett DK, et al., 2019) is: 2.5%  Health Maintenance Due  Topic Date Due   HIV Screening  Never done   Hepatitis B Vaccines 19-59 Average Risk (1 of 3 - 19+ 3-dose series) Never done   Pneumococcal Vaccine: 50+ Years (1 of 1 - PCV) Never done   Influenza Vaccine  07/11/2024   COVID-19 Vaccine (6 - 2025-26 season) 08/11/2024      Objective:     LMP 04/18/2018 (Exact Date)  {Vitals History (Optional):23777}  Physical Exam   No results found for any visits on 11/03/24.      Assessment & Plan:   There are no diagnoses linked to this encounter.   No follow-ups on file.    Toribio MARLA Slain, MD

## 2024-11-06 DIAGNOSIS — Z0000 Encounter for general adult medical examination without abnormal findings: Secondary | ICD-10-CM | POA: Insufficient documentation

## 2024-11-06 NOTE — Assessment & Plan Note (Signed)
 At goal. Continue atorvastatin  20

## 2024-11-06 NOTE — Assessment & Plan Note (Addendum)
-   Follow-up for annual physical. No new concerns reported. All labs from last visit were normal. Continues current medication regimen. - Continue current medications. - Continue current exercise routine. - Schedule next year's physical with labs. - If refills are needed before next appointment, pharmacy will send a request. - Patient is due for a pneumococcal vaccine as per new recommendations for age 58+. Has never had one before. - Administered Prevnar 20 today. Counseled this is a one-time dose. - Reports receiving flu vaccine about a month ago.

## 2024-11-06 NOTE — Assessment & Plan Note (Signed)
 At goal. Continue levothyroxine  25mcg daily

## 2024-11-12 ENCOUNTER — Ambulatory Visit (HOSPITAL_BASED_OUTPATIENT_CLINIC_OR_DEPARTMENT_OTHER)
Admission: RE | Admit: 2024-11-12 | Discharge: 2024-11-12 | Disposition: A | Source: Ambulatory Visit | Attending: Obstetrics and Gynecology | Admitting: Obstetrics and Gynecology

## 2024-11-12 DIAGNOSIS — E2839 Other primary ovarian failure: Secondary | ICD-10-CM

## 2024-11-12 DIAGNOSIS — M85832 Other specified disorders of bone density and structure, left forearm: Secondary | ICD-10-CM | POA: Diagnosis not present

## 2024-11-12 DIAGNOSIS — Z780 Asymptomatic menopausal state: Secondary | ICD-10-CM | POA: Diagnosis not present

## 2024-11-20 ENCOUNTER — Telehealth: Payer: Self-pay | Admitting: Family Medicine

## 2024-11-20 NOTE — Telephone Encounter (Signed)
 Called to reschedule physical appointment and patient stated she would like Dr. Chandra to review her Bone Density and call her with the results.

## 2025-01-11 ENCOUNTER — Other Ambulatory Visit: Payer: Self-pay | Admitting: Family Medicine

## 2025-01-11 DIAGNOSIS — E785 Hyperlipidemia, unspecified: Secondary | ICD-10-CM

## 2025-04-01 ENCOUNTER — Ambulatory Visit: Admitting: Endocrinology

## 2025-04-08 ENCOUNTER — Ambulatory Visit: Admitting: Endocrinology

## 2025-10-28 ENCOUNTER — Other Ambulatory Visit

## 2025-11-04 ENCOUNTER — Encounter: Admitting: Family Medicine

## 2025-11-09 ENCOUNTER — Encounter: Admitting: Family Medicine
